# Patient Record
Sex: Female | Born: 1945 | Race: White | Hispanic: No | Marital: Married | Smoking: Former smoker
Health system: Southern US, Community
[De-identification: ages and names within clinical notes are randomized; demographics above are authoritative.]

## PROBLEM LIST (undated history)

## (undated) DIAGNOSIS — N952 Postmenopausal atrophic vaginitis: Secondary | ICD-10-CM

## (undated) DIAGNOSIS — K219 Gastro-esophageal reflux disease without esophagitis: Secondary | ICD-10-CM

## (undated) DIAGNOSIS — C4499 Other specified malignant neoplasm of skin, unspecified: Secondary | ICD-10-CM

## (undated) DIAGNOSIS — R42 Dizziness and giddiness: Secondary | ICD-10-CM

## (undated) DIAGNOSIS — C50919 Malignant neoplasm of unspecified site of unspecified female breast: Secondary | ICD-10-CM

## (undated) DIAGNOSIS — C801 Malignant (primary) neoplasm, unspecified: Secondary | ICD-10-CM

## (undated) DIAGNOSIS — H9319 Tinnitus, unspecified ear: Secondary | ICD-10-CM

## (undated) DIAGNOSIS — R519 Headache, unspecified: Secondary | ICD-10-CM

## (undated) DIAGNOSIS — Z923 Personal history of irradiation: Secondary | ICD-10-CM

## (undated) DIAGNOSIS — M722 Plantar fascial fibromatosis: Secondary | ICD-10-CM

## (undated) DIAGNOSIS — K589 Irritable bowel syndrome without diarrhea: Secondary | ICD-10-CM

## (undated) DIAGNOSIS — E785 Hyperlipidemia, unspecified: Secondary | ICD-10-CM

## (undated) HISTORY — PX: BREAST LUMPECTOMY: SHX2

## (undated) HISTORY — PX: BASAL CELL CARCINOMA EXCISION: SHX1214

## (undated) HISTORY — PX: OTHER SURGICAL HISTORY: SHX169

---

## 1999-11-13 DIAGNOSIS — Z923 Personal history of irradiation: Secondary | ICD-10-CM

## 1999-11-13 DIAGNOSIS — C50919 Malignant neoplasm of unspecified site of unspecified female breast: Secondary | ICD-10-CM

## 1999-11-13 HISTORY — DX: Malignant neoplasm of unspecified site of unspecified female breast: C50.919

## 1999-11-13 HISTORY — PX: BREAST BIOPSY: SHX20

## 1999-11-13 HISTORY — DX: Personal history of irradiation: Z92.3

## 1999-11-13 HISTORY — PX: MASTECTOMY: SHX3

## 2009-01-03 ENCOUNTER — Ambulatory Visit: Payer: Self-pay | Admitting: Gastroenterology

## 2010-07-09 ENCOUNTER — Other Ambulatory Visit: Payer: Self-pay | Admitting: Family Medicine

## 2012-07-31 ENCOUNTER — Other Ambulatory Visit: Payer: Self-pay | Admitting: Gastroenterology

## 2013-01-27 ENCOUNTER — Ambulatory Visit: Payer: Self-pay

## 2014-01-11 ENCOUNTER — Ambulatory Visit: Payer: Self-pay | Admitting: Gastroenterology

## 2014-01-13 LAB — PATHOLOGY REPORT

## 2014-02-19 ENCOUNTER — Ambulatory Visit: Payer: Self-pay

## 2015-02-21 ENCOUNTER — Ambulatory Visit
Admit: 2015-02-21 | Disposition: A | Discharge status: Home / Self Care | Payer: Self-pay | Attending: Obstetrics and Gynecology | Admitting: Obstetrics and Gynecology

## 2015-03-10 DIAGNOSIS — L280 Lichen simplex chronicus: Secondary | ICD-10-CM | POA: Insufficient documentation

## 2015-03-28 DIAGNOSIS — R768 Other specified abnormal immunological findings in serum: Secondary | ICD-10-CM | POA: Insufficient documentation

## 2015-04-18 DIAGNOSIS — N952 Postmenopausal atrophic vaginitis: Secondary | ICD-10-CM | POA: Insufficient documentation

## 2015-04-18 DIAGNOSIS — C50919 Malignant neoplasm of unspecified site of unspecified female breast: Secondary | ICD-10-CM | POA: Insufficient documentation

## 2015-04-18 DIAGNOSIS — E785 Hyperlipidemia, unspecified: Secondary | ICD-10-CM | POA: Insufficient documentation

## 2015-04-18 DIAGNOSIS — K589 Irritable bowel syndrome without diarrhea: Secondary | ICD-10-CM | POA: Insufficient documentation

## 2015-04-18 DIAGNOSIS — K219 Gastro-esophageal reflux disease without esophagitis: Secondary | ICD-10-CM | POA: Insufficient documentation

## 2015-04-18 DIAGNOSIS — M722 Plantar fascial fibromatosis: Secondary | ICD-10-CM | POA: Insufficient documentation

## 2016-02-09 DIAGNOSIS — R002 Palpitations: Secondary | ICD-10-CM | POA: Insufficient documentation

## 2016-03-04 ENCOUNTER — Encounter: Payer: Self-pay | Admitting: *Deleted

## 2016-03-04 ENCOUNTER — Emergency Department: Payer: Medicare Other

## 2016-03-04 ENCOUNTER — Emergency Department
Admission: EM | Admit: 2016-03-04 | Discharge: 2016-03-04 | Disposition: A | Discharge status: Home / Self Care | Payer: Medicare Other | Attending: Emergency Medicine | Admitting: Emergency Medicine

## 2016-03-04 DIAGNOSIS — R002 Palpitations: Secondary | ICD-10-CM | POA: Diagnosis not present

## 2016-03-04 DIAGNOSIS — Z853 Personal history of malignant neoplasm of breast: Secondary | ICD-10-CM | POA: Diagnosis not present

## 2016-03-04 HISTORY — DX: Malignant (primary) neoplasm, unspecified: C80.1

## 2016-03-04 LAB — CBC
HCT: 41.4 % (ref 35.0–47.0)
Hemoglobin: 14.3 g/dL (ref 12.0–16.0)
MCH: 36.3 pg — AB (ref 26.0–34.0)
MCHC: 34.6 g/dL (ref 32.0–36.0)
MCV: 104.9 fL — ABNORMAL HIGH (ref 80.0–100.0)
PLATELETS: 333 10*3/uL (ref 150–440)
RBC: 3.95 MIL/uL (ref 3.80–5.20)
RDW: 12.9 % (ref 11.5–14.5)
WBC: 9 10*3/uL (ref 3.6–11.0)

## 2016-03-04 LAB — BASIC METABOLIC PANEL
Anion gap: 10 (ref 5–15)
BUN: 14 mg/dL (ref 6–20)
CALCIUM: 9.7 mg/dL (ref 8.9–10.3)
CO2: 22 mmol/L (ref 22–32)
CREATININE: 0.69 mg/dL (ref 0.44–1.00)
Chloride: 110 mmol/L (ref 101–111)
GFR calc non Af Amer: >60 mL/min (ref >60)
Glucose, Bld: 87 mg/dL (ref 65–99)
Potassium: 3.7 mmol/L (ref 3.5–5.1)
SODIUM: 142 mmol/L (ref 135–145)

## 2016-03-04 MED ORDER — METOPROLOL TARTRATE 25 MG PO TABS: 25.0000 mg | ORAL_TABLET | Freq: Two times a day (BID) | ORAL | Status: DC

## 2016-03-04 MED ORDER — METOPROLOL TARTRATE 25 MG PO TABS
25.0000 mg | ORAL_TABLET | Freq: Once | ORAL | Status: AC
  Administered 2016-03-04: 25 mg via ORAL
  Filled 2016-03-04: qty 1

## 2016-03-04 NOTE — Discharge Instructions (Signed)

## 2016-03-04 NOTE — ED Notes (Signed)
Patient states she has noticed an irregular heart rate since early March and had a Holter monitor on for 24 hours at the end of March. Patient states the monitor showed PVC's and "atrial irregularities." Patient states she has felt more irregularities in the last 12 hours. Patient denies shortness of breath, but does state she is feeling anxious and a little light-headed.

## 2016-03-04 NOTE — ED Notes (Signed)
Pt verbalized understanding of discharge instructions. NAD at this time. 

## 2016-03-04 NOTE — ED Provider Notes (Signed)
[facility] Emergency Department Provider Note  Time seen: 5:55 PM  I have reviewed the triage vital signs and the nursing notes.   HISTORY  Chief Complaint Irregular Heart Beat    HPI Dorothy Turner is a 70 y.o. female with no past medical history who presents to the emergency department with an irregular heartbeat. According to the patient for the past several months she has been having frequent heart palpitations. She states these are most evident at night but occur throughout the day. States they take her breath away and have been detrimental to her daily life. Patient saw Dr. Saralyn Pilar at the end of March for the same, had a 24 hour Holter monitor showing mostly PVCs with occasional PAC. Patient states she had a very bad experience with the cardiology office, got into a verbal argument over the phone, and was never able to get a follow-up appointment. Patient states she continues to have these irregular heartbeats and feels like they have worsened. Denies any chest pain. Patient was unable to get in with cardiology or to get the official results of the Holter monitor so she came to the emergency department.     Past Medical History  Diagnosis Date  . Cancer ([place])     right breast, squamous and basil cell     There are no active problems to display for this patient.   Past Surgical History  Procedure Laterality Date  . Breast lumpectomy Right     2001 with radiation  . 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  Allergies Review of patient's allergies indicates no known allergies.  No family history on file.  Social History Social History  Substance Use Topics  . Smoking status: Never Smoker   . Smokeless tobacco: None  . Alcohol Use: Yes     Comment: 1-2 glasses of wine per night    Review of Systems Constitutional: Negative for fever. Cardiovascular: Negative for chest pain.Positive  palpitations. Respiratory: Negative for shortness of breath. Gastrointestinal: Negative for abdominal pain Neurological: Negative for headache 10-point ROS otherwise negative.  ____________________________________________   PHYSICAL EXAM:  VITAL SIGNS: ED Triage Vitals  Enc Vitals Group     BP 03/04/16 1618 159/73 mmHg     Pulse Rate 03/04/16 1618 78     Resp 03/04/16 1618 18     Temp 03/04/16 1618 98 F (36.7 C)     Temp Source 03/04/16 1618 Oral     SpO2 03/04/16 1618 100 %     Weight 03/04/16 1618 155 lb (70.308 kg)     Height 03/04/16 1618 5\' 3"  (1.6 m)     Head Cir --      Peak Flow --      Pain Score --      Pain Loc --      Pain Edu? --      Excl. in [place]? --     Constitutional: Alert and oriented. Well appearing and in no distress. Eyes: Normal exam ENT   Head: Normocephalic and atraumatic.   Mouth/Throat: Mucous membranes are moist. Cardiovascular: Normal rate, regular rhythm. No murmur Respiratory: Normal respiratory effort without tachypnea nor retractions. Breath sounds are clear Gastrointestinal: Soft and nontender. No distention.   Musculoskeletal: Nontender with normal range of motion in all extremities. Neurologic:  Normal speech and language. No gross focal neurologic deficits Skin:  Skin is warm, dry and intact.  Psychiatric: Mood and affect are normal. Speech and behavior are normal.   ____________________________________________    EKG  EKG reviewed and interpreted by myself shows normal sinus rhythm at 75 bpm, narrow QRS, normal axis, normal intervals, no concerning ST changes. Overall normal EKG.  Chest x-ray negative ____________________________________________   INITIAL IMPRESSION / ASSESSMENT AND PLAN / ED COURSE  Pertinent labs & imaging results that were available during my care of the patient were reviewed by me and considered in my medical decision making (see chart for details).  Patient presents to the emergency department  frequent palpitations. I was able to review the patient's records from cardiology. Patient had a 24 hour Holter monitor performed showing frequent PVCs and occasional PAC. Given the patient's frequency of her PVCs, we'll place the patient on metoprolol and have her follow-up with a different cardiologist per her request. Patient's labs are within normal limits including potassium. EKG is reassuring.  ____________________________________________   FINAL CLINICAL IMPRESSION(S) / ED DIAGNOSES  Palpitations   Harvest Dark, MD 03/04/16 [phone]

## 2016-03-19 ENCOUNTER — Other Ambulatory Visit: Payer: Self-pay | Admitting: Obstetrics and Gynecology

## 2016-03-19 DIAGNOSIS — Z1231 Encounter for screening mammogram for malignant neoplasm of breast: Secondary | ICD-10-CM

## 2016-03-28 ENCOUNTER — Other Ambulatory Visit: Payer: Self-pay | Admitting: Obstetrics and Gynecology

## 2016-03-28 ENCOUNTER — Ambulatory Visit
Admission: RE | Admit: 2016-03-28 | Discharge: 2016-03-28 | Disposition: A | Discharge status: Home / Self Care | Payer: Medicare Other | Source: Ambulatory Visit | Attending: Obstetrics and Gynecology | Admitting: Obstetrics and Gynecology

## 2016-03-28 DIAGNOSIS — Z1231 Encounter for screening mammogram for malignant neoplasm of breast: Secondary | ICD-10-CM | POA: Insufficient documentation

## 2016-03-28 HISTORY — DX: Malignant neoplasm of unspecified site of unspecified female breast: C50.919

## 2016-04-19 ENCOUNTER — Other Ambulatory Visit: Payer: Self-pay | Admitting: *Deleted

## 2016-04-19 DIAGNOSIS — E049 Nontoxic goiter, unspecified: Secondary | ICD-10-CM

## 2016-04-27 ENCOUNTER — Ambulatory Visit
Admission: RE | Admit: 2016-04-27 | Discharge: 2016-04-27 | Disposition: A | Discharge status: Home / Self Care | Payer: Medicare Other | Source: Ambulatory Visit | Attending: *Deleted | Admitting: *Deleted

## 2016-04-27 DIAGNOSIS — E049 Nontoxic goiter, unspecified: Secondary | ICD-10-CM

## 2017-02-21 ENCOUNTER — Other Ambulatory Visit: Payer: Self-pay | Admitting: Obstetrics and Gynecology

## 2017-03-04 ENCOUNTER — Encounter: Payer: Self-pay | Admitting: Podiatry

## 2017-03-04 ENCOUNTER — Ambulatory Visit (INDEPENDENT_AMBULATORY_CARE_PROVIDER_SITE_OTHER): Payer: Medicare Other | Admitting: Podiatry

## 2017-03-04 DIAGNOSIS — L603 Nail dystrophy: Secondary | ICD-10-CM

## 2017-03-04 NOTE — Progress Notes (Signed)
   Subjective:    Patient ID: ZYRIAH MASK, female    DOB: 12/28/1945, 71 y.o.   MRN: [id number]  HPI: She presents today concerned about thick painful hallux nail left. She states that she's tried over-the-counter antifungals to no avail.    Review of Systems  Eyes: Positive for pain and visual disturbance.  Gastrointestinal: Positive for abdominal distention and constipation.  Musculoskeletal: Positive for arthralgias.  All other systems reviewed and are negative.      Objective:   Physical Exam: Vital signs are stable alert and oriented 3. Pulses are palpable. Neurologic sensorium is intact per Semmes-Weinstein monofilament. Degenerative flexors are intact. Muscle strength is 5 over 5 dorsiflexion and plantar flexors and inverters everters onto the musculature is intact. Orthopedic evaluation of his rectal joints distal to the ankle for range of motion or crepitation. Cutaneous evaluation of Mr. supple well-hydrated cutis toenails are thick yellow dystrophic with mycotic ointment hallux left with sharp incurvated nail margin. Onychocryptosis is noted.      Assessment & Plan:  Assessment: Onychocryptosis nail dystrophy hallux nail left greater than the right. Fifth digital nail plate left greater than right. Appear to be mycotic.  Plan: Samples of the nail were taken today to be several pathologic evaluation discussed possible matrixectomy with her in the future.

## 2017-03-18 ENCOUNTER — Encounter: Payer: Self-pay | Admitting: Podiatry

## 2017-03-18 ENCOUNTER — Ambulatory Visit (INDEPENDENT_AMBULATORY_CARE_PROVIDER_SITE_OTHER): Payer: Medicare Other | Admitting: Podiatry

## 2017-03-18 DIAGNOSIS — L603 Nail dystrophy: Secondary | ICD-10-CM

## 2017-03-18 DIAGNOSIS — Z79899 Other long term (current) drug therapy: Secondary | ICD-10-CM | POA: Diagnosis not present

## 2017-03-18 MED ORDER — TERBINAFINE HCL 250 MG PO TABS: 250.0000 mg | ORAL_TABLET | Freq: Every day | ORAL | 0 refills | Status: DC

## 2017-03-18 NOTE — Progress Notes (Signed)
She presents today for follow-up of her positive pathology report for fungus.  Objective: Onychomycosis per pathology report.  Assessment: Onychomycosis.  Plan started her on Lamisil today after discussion of oral versus laser therapy. She understands the possible consequences and side effects of the medication. We provided her with a liver profile requisition and a prescription for 30 days worth of terbinafine. I will follow-up with her in 1 month should have liver plantar profile come back abnormal I will notify her immediately.

## 2017-03-18 NOTE — Patient Instructions (Signed)

## 2017-03-19 ENCOUNTER — Telehealth: Payer: Self-pay | Admitting: *Deleted

## 2017-03-19 LAB — HEPATIC FUNCTION PANEL
ALK PHOS: 90 IU/L (ref 39–117)
ALT: 22 IU/L (ref 0–32)
AST: 28 IU/L (ref 0–40)
Albumin: 4.6 g/dL (ref 3.6–4.8)
BILIRUBIN TOTAL: 0.4 mg/dL (ref 0.0–1.2)
BILIRUBIN, DIRECT: 0.11 mg/dL (ref 0.00–0.40)
Total Protein: 6.7 g/dL (ref 6.0–8.5)

## 2017-03-19 NOTE — Telephone Encounter (Addendum)
-----   Message from Garrel Ridgel, [place] sent at 03/19/2017  7:11 AM EDT ----- Blood work looks good so may continue medication. Left message informing pt of Dr. Stephenie Acres orders.

## 2017-03-20 ENCOUNTER — Other Ambulatory Visit: Payer: Self-pay | Admitting: Obstetrics and Gynecology

## 2017-03-20 DIAGNOSIS — Z1231 Encounter for screening mammogram for malignant neoplasm of breast: Secondary | ICD-10-CM

## 2017-03-25 DIAGNOSIS — M1712 Unilateral primary osteoarthritis, left knee: Secondary | ICD-10-CM | POA: Insufficient documentation

## 2017-04-05 ENCOUNTER — Ambulatory Visit
Admission: RE | Admit: 2017-04-05 | Discharge: 2017-04-05 | Disposition: A | Discharge status: Home / Self Care | Payer: Medicare Other | Source: Ambulatory Visit | Attending: Obstetrics and Gynecology | Admitting: Obstetrics and Gynecology

## 2017-04-05 DIAGNOSIS — Z1231 Encounter for screening mammogram for malignant neoplasm of breast: Secondary | ICD-10-CM | POA: Diagnosis not present

## 2017-04-05 HISTORY — DX: Personal history of irradiation: Z92.3

## 2017-04-10 ENCOUNTER — Ambulatory Visit: Payer: Medicare Other | Admitting: Podiatry

## 2017-04-24 ENCOUNTER — Encounter: Payer: Self-pay | Admitting: Podiatry

## 2017-04-24 ENCOUNTER — Ambulatory Visit (INDEPENDENT_AMBULATORY_CARE_PROVIDER_SITE_OTHER): Payer: Medicare Other | Admitting: Podiatry

## 2017-04-24 DIAGNOSIS — L603 Nail dystrophy: Secondary | ICD-10-CM | POA: Diagnosis not present

## 2017-04-24 DIAGNOSIS — Z79899 Other long term (current) drug therapy: Secondary | ICD-10-CM

## 2017-04-24 MED ORDER — TERBINAFINE HCL 250 MG PO TABS: 250.0000 mg | ORAL_TABLET | Freq: Every day | ORAL | 0 refills | Status: DC

## 2017-04-24 NOTE — Progress Notes (Signed)
She presents today for follow-up of her nail fungus and her treatment plan. States that she had no problems taking the Lamisil other than a few nights of the stomach upset.  Objective: Vital signs are stable she is alert and oriented 3. She denies fever chills nausea vomiting muscle aches and pains. No change in physical exam yet.  Assessment: Onychomycosis seen treated long-term with Lamisil.  Plan: Follow up with me in 4 months. Dispensed a requisition for blood work. I will call her with these results. Rotator prescription for Lamisil No. 90 one tablet daily.

## 2017-04-26 LAB — HEPATIC FUNCTION PANEL
ALBUMIN: 4.6 g/dL (ref 3.6–4.8)
ALT: 20 IU/L (ref 0–32)
AST: 24 IU/L (ref 0–40)
Alkaline Phosphatase: 82 IU/L (ref 39–117)
BILIRUBIN TOTAL: 0.3 mg/dL (ref 0.0–1.2)
BILIRUBIN, DIRECT: 0.11 mg/dL (ref 0.00–0.40)
Total Protein: 6.6 g/dL (ref 6.0–8.5)

## 2017-04-29 ENCOUNTER — Telehealth: Payer: Self-pay | Admitting: *Deleted

## 2017-04-29 NOTE — Telephone Encounter (Addendum)
-----   Message from Garrel Ridgel, [place] sent at 04/28/2017 10:43 AM EDT ----- Blood work looks great continue medication.04/29/2017-Left message informing pt informing pt of Dr. Stephenie Acres review of results and orders, and to call with concerns.

## 2017-08-26 ENCOUNTER — Encounter: Payer: Self-pay | Admitting: Podiatry

## 2017-08-26 ENCOUNTER — Ambulatory Visit (INDEPENDENT_AMBULATORY_CARE_PROVIDER_SITE_OTHER): Payer: Medicare Other | Admitting: Podiatry

## 2017-08-26 VITALS — BP 119/75 | HR 97 | Resp 16

## 2017-08-26 DIAGNOSIS — L603 Nail dystrophy: Secondary | ICD-10-CM | POA: Diagnosis not present

## 2017-08-26 DIAGNOSIS — Z79899 Other long term (current) drug therapy: Secondary | ICD-10-CM

## 2017-08-26 MED ORDER — TERBINAFINE HCL 250 MG PO TABS: 250.0000 mg | ORAL_TABLET | Freq: Every day | ORAL | 0 refills | Status: DC

## 2017-08-26 NOTE — Progress Notes (Signed)
She presents today for follow-up of her Lamisil. She has completed 120 days of Lamisil medication at this time. She states my toes are looking great happy with the outcome. Denies any problem with the medication other than a little stomach upset occasionally.  Objective: Vital signs are stable alert and oriented 3. Pulses are palpable. Toenails appear to be 85-90% improved.  Assessment: Onychomycosis.  Plan: Continue treating with Lamisil 250 mg 1 by mouth every other day. #30 no refills follow up with me in 3 months.

## 2017-08-26 NOTE — Patient Instructions (Signed)
Dr. Hyatt has sent over a refill for Lamisil to your pharmacy today. The instructions on your bottle will say "take 1 tablet daily", however, he would like for you to take one pill every other day. He will follow up with you in 3 months to re-evaluate your toenails. 

## 2017-11-12 HISTORY — PX: VULVECTOMY PARTIAL: SHX6187

## 2017-11-27 ENCOUNTER — Ambulatory Visit (INDEPENDENT_AMBULATORY_CARE_PROVIDER_SITE_OTHER): Payer: Medicare Other | Admitting: Podiatry

## 2017-11-27 ENCOUNTER — Encounter: Payer: Self-pay | Admitting: Podiatry

## 2017-11-27 DIAGNOSIS — L603 Nail dystrophy: Secondary | ICD-10-CM | POA: Diagnosis not present

## 2017-11-27 MED ORDER — TERBINAFINE HCL 250 MG PO TABS: 250.0000 mg | ORAL_TABLET | Freq: Every day | ORAL | 0 refills | Status: DC

## 2017-11-27 NOTE — Patient Instructions (Signed)
Dr. Hyatt has sent over a refill for Lamisil to your pharmacy today. The instructions on your bottle will say "take 1 tablet daily", however, he would like for you to take one pill every other day. He will follow up with you in 3 months to re-evaluate your toenails. 

## 2017-11-27 NOTE — Progress Notes (Signed)
She presents today for follow-up of nail dystrophy and a med check.  She states that have not noticed that much improvement but I can tell that it seems to be growing a little bit she states that they are not as dark in color as they were.  Objective: Vital signs are stable she is alert and oriented x3.  Mild sharp incurvated nail margins to the left hallux along the fibular border.  Otherwise the nail plates appear to be growing out quite nicely approximately two thirds of the way out.  Assessment: Mild paronychia due to ingrown nail which I trimmed for her today also long-term therapy with Lamisil.  Plan: Another 30 tablets 1 tablet every other day for the next 2 months follow-up with me in 3 months I did debride the nail for her today follow-up with me

## 2018-02-20 DIAGNOSIS — R300 Dysuria: Secondary | ICD-10-CM | POA: Insufficient documentation

## 2018-02-20 DIAGNOSIS — N814 Uterovaginal prolapse, unspecified: Secondary | ICD-10-CM | POA: Insufficient documentation

## 2018-02-20 DIAGNOSIS — R140 Abdominal distension (gaseous): Secondary | ICD-10-CM | POA: Insufficient documentation

## 2018-02-26 ENCOUNTER — Encounter: Payer: Self-pay | Admitting: Podiatry

## 2018-02-26 ENCOUNTER — Ambulatory Visit: Payer: Medicare Other | Admitting: Podiatry

## 2018-02-26 ENCOUNTER — Ambulatory Visit (INDEPENDENT_AMBULATORY_CARE_PROVIDER_SITE_OTHER): Payer: Medicare Other | Admitting: Podiatry

## 2018-02-26 DIAGNOSIS — L603 Nail dystrophy: Secondary | ICD-10-CM | POA: Diagnosis not present

## 2018-02-26 MED ORDER — TERBINAFINE HCL 250 MG PO TABS: 250.0000 mg | ORAL_TABLET | Freq: Every day | ORAL | 0 refills | Status: DC

## 2018-02-26 NOTE — Progress Notes (Signed)
She presents today for follow-up of her Lamisil therapy.  She states that it has improved a lot and very happy with the outcome is not nearly as painful as it was in the past.  She states they are not thickened not as curved as before.  She denies any problems taking the medication.  States that there has been no change in her medication profile.  Objective: Vital signs are stable she is alert and oriented x3 her toenails are approximately 95% grown out clear.  They have decreased in curvature considerably particularly the one on the left foot.  Assessment: Long-term therapy with Lamisil secondary to onychomycosis.  Plan: At this point since they are not grown out 100% I feel that is necessary to continue at least an every other day regimen of Lamisil tablets 250 mg tablets 1 p.o. every other day and I will follow-up with her in 3 months.

## 2018-03-31 ENCOUNTER — Other Ambulatory Visit: Payer: Self-pay | Admitting: Obstetrics and Gynecology

## 2018-03-31 DIAGNOSIS — Z1231 Encounter for screening mammogram for malignant neoplasm of breast: Secondary | ICD-10-CM

## 2018-04-17 ENCOUNTER — Ambulatory Visit
Admission: RE | Admit: 2018-04-17 | Discharge: 2018-04-17 | Disposition: A | Discharge status: Home / Self Care | Payer: Medicare Other | Source: Ambulatory Visit | Attending: Obstetrics and Gynecology | Admitting: Obstetrics and Gynecology

## 2018-04-17 DIAGNOSIS — Z1231 Encounter for screening mammogram for malignant neoplasm of breast: Secondary | ICD-10-CM | POA: Diagnosis not present

## 2018-04-21 ENCOUNTER — Other Ambulatory Visit: Payer: Self-pay | Admitting: Obstetrics and Gynecology

## 2018-04-21 DIAGNOSIS — C519 Malignant neoplasm of vulva, unspecified: Secondary | ICD-10-CM | POA: Insufficient documentation

## 2018-04-21 DIAGNOSIS — R921 Mammographic calcification found on diagnostic imaging of breast: Secondary | ICD-10-CM

## 2018-04-21 DIAGNOSIS — R928 Other abnormal and inconclusive findings on diagnostic imaging of breast: Secondary | ICD-10-CM

## 2018-05-07 ENCOUNTER — Ambulatory Visit
Admission: RE | Admit: 2018-05-07 | Discharge: 2018-05-07 | Disposition: A | Discharge status: Home / Self Care | Payer: Medicare Other | Source: Ambulatory Visit | Attending: Obstetrics and Gynecology | Admitting: Obstetrics and Gynecology

## 2018-05-07 DIAGNOSIS — R928 Other abnormal and inconclusive findings on diagnostic imaging of breast: Secondary | ICD-10-CM | POA: Diagnosis present

## 2018-05-07 DIAGNOSIS — R921 Mammographic calcification found on diagnostic imaging of breast: Secondary | ICD-10-CM | POA: Diagnosis present

## 2018-05-19 ENCOUNTER — Ambulatory Visit: Payer: Medicare Other

## 2018-05-19 ENCOUNTER — Other Ambulatory Visit: Payer: Medicare Other

## 2018-05-28 ENCOUNTER — Encounter: Payer: Self-pay | Admitting: Podiatry

## 2018-05-28 ENCOUNTER — Ambulatory Visit (INDEPENDENT_AMBULATORY_CARE_PROVIDER_SITE_OTHER): Payer: Medicare Other | Admitting: Podiatry

## 2018-05-28 DIAGNOSIS — L603 Nail dystrophy: Secondary | ICD-10-CM | POA: Diagnosis not present

## 2018-05-28 NOTE — Progress Notes (Signed)
She presents today for follow-up of her nail fungus.  States that seems to be doing so much better is very happy with the outcome thus far.  She denied any problems taking medication.  Objective: Vital signs are stable she is alert and oriented x3.  Toenail plates appear to be 856% resolved and free of fungus.  Assessment: Well-healing onychomycosis.  Plan: Discontinue the use of Lamisil follow-up with me on as-needed basis.

## 2018-10-14 ENCOUNTER — Other Ambulatory Visit: Payer: Self-pay | Admitting: Obstetrics and Gynecology

## 2018-10-14 DIAGNOSIS — R921 Mammographic calcification found on diagnostic imaging of breast: Secondary | ICD-10-CM

## 2018-11-07 ENCOUNTER — Ambulatory Visit
Admission: RE | Admit: 2018-11-07 | Discharge: 2018-11-07 | Disposition: A | Discharge status: Home / Self Care | Payer: Medicare Other | Source: Ambulatory Visit | Attending: Obstetrics and Gynecology | Admitting: Obstetrics and Gynecology

## 2018-11-07 DIAGNOSIS — R921 Mammographic calcification found on diagnostic imaging of breast: Secondary | ICD-10-CM | POA: Diagnosis present

## 2018-11-11 ENCOUNTER — Other Ambulatory Visit: Payer: Self-pay | Admitting: Obstetrics and Gynecology

## 2018-11-11 DIAGNOSIS — R928 Other abnormal and inconclusive findings on diagnostic imaging of breast: Secondary | ICD-10-CM

## 2018-11-11 DIAGNOSIS — Z1231 Encounter for screening mammogram for malignant neoplasm of breast: Secondary | ICD-10-CM

## 2018-11-11 DIAGNOSIS — R921 Mammographic calcification found on diagnostic imaging of breast: Secondary | ICD-10-CM

## 2018-11-18 ENCOUNTER — Other Ambulatory Visit (HOSPITAL_COMMUNITY): Payer: Self-pay | Admitting: Otolaryngology

## 2018-11-18 ENCOUNTER — Other Ambulatory Visit: Payer: Self-pay | Admitting: Otolaryngology

## 2018-11-18 DIAGNOSIS — IMO0001 Reserved for inherently not codable concepts without codable children: Secondary | ICD-10-CM

## 2018-11-18 DIAGNOSIS — R42 Dizziness and giddiness: Secondary | ICD-10-CM

## 2018-11-18 DIAGNOSIS — H903 Sensorineural hearing loss, bilateral: Secondary | ICD-10-CM

## 2018-11-26 ENCOUNTER — Ambulatory Visit
Admission: RE | Admit: 2018-11-26 | Discharge: 2018-11-26 | Disposition: A | Discharge status: Home / Self Care | Payer: Medicare Other | Source: Ambulatory Visit | Attending: Otolaryngology | Admitting: Otolaryngology

## 2018-11-26 DIAGNOSIS — R42 Dizziness and giddiness: Secondary | ICD-10-CM | POA: Insufficient documentation

## 2018-11-26 DIAGNOSIS — IMO0001 Reserved for inherently not codable concepts without codable children: Secondary | ICD-10-CM

## 2018-11-26 DIAGNOSIS — H903 Sensorineural hearing loss, bilateral: Secondary | ICD-10-CM | POA: Diagnosis present

## 2018-11-26 LAB — POCT I-STAT CREATININE: CREATININE: 0.7 mg/dL (ref 0.44–1.00)

## 2018-11-26 MED ORDER — GADOBUTROL 1 MMOL/ML IV SOLN
7.0000 mL | Freq: Once | INTRAVENOUS | Status: AC | PRN
  Administered 2018-11-26: 7 mL via INTRAVENOUS

## 2019-03-04 DIAGNOSIS — R42 Dizziness and giddiness: Secondary | ICD-10-CM | POA: Insufficient documentation

## 2019-03-04 DIAGNOSIS — G4700 Insomnia, unspecified: Secondary | ICD-10-CM | POA: Insufficient documentation

## 2019-03-04 DIAGNOSIS — H9319 Tinnitus, unspecified ear: Secondary | ICD-10-CM | POA: Insufficient documentation

## 2019-04-01 DIAGNOSIS — G2581 Restless legs syndrome: Secondary | ICD-10-CM | POA: Insufficient documentation

## 2019-04-01 DIAGNOSIS — R519 Headache, unspecified: Secondary | ICD-10-CM | POA: Insufficient documentation

## 2019-04-01 DIAGNOSIS — R52 Pain, unspecified: Secondary | ICD-10-CM | POA: Insufficient documentation

## 2019-04-01 DIAGNOSIS — M6289 Other specified disorders of muscle: Secondary | ICD-10-CM | POA: Insufficient documentation

## 2019-04-20 ENCOUNTER — Ambulatory Visit
Admission: RE | Admit: 2019-04-20 | Discharge: 2019-04-20 | Disposition: A | Discharge status: Home / Self Care | Payer: Medicare Other | Source: Ambulatory Visit | Attending: Obstetrics and Gynecology | Admitting: Obstetrics and Gynecology

## 2019-04-20 ENCOUNTER — Other Ambulatory Visit: Payer: Self-pay

## 2019-04-20 DIAGNOSIS — R928 Other abnormal and inconclusive findings on diagnostic imaging of breast: Secondary | ICD-10-CM

## 2019-04-20 DIAGNOSIS — R921 Mammographic calcification found on diagnostic imaging of breast: Secondary | ICD-10-CM | POA: Diagnosis present

## 2019-06-11 DIAGNOSIS — E559 Vitamin D deficiency, unspecified: Secondary | ICD-10-CM | POA: Insufficient documentation

## 2019-06-11 DIAGNOSIS — R7982 Elevated C-reactive protein (CRP): Secondary | ICD-10-CM | POA: Insufficient documentation

## 2019-06-11 DIAGNOSIS — F419 Anxiety disorder, unspecified: Secondary | ICD-10-CM | POA: Insufficient documentation

## 2019-06-11 DIAGNOSIS — Z77120 Contact with and (suspected) exposure to mold (toxic): Secondary | ICD-10-CM | POA: Insufficient documentation

## 2019-06-11 DIAGNOSIS — E538 Deficiency of other specified B group vitamins: Secondary | ICD-10-CM | POA: Insufficient documentation

## 2019-08-14 DIAGNOSIS — N3946 Mixed incontinence: Secondary | ICD-10-CM | POA: Insufficient documentation

## 2019-09-07 ENCOUNTER — Ambulatory Visit: Payer: Federal, State, Local not specified - PPO | Admitting: Podiatry

## 2019-09-14 HISTORY — PX: ANTERIOR AND POSTERIOR VAGINAL REPAIR: SUR5

## 2019-09-14 HISTORY — PX: BLADDER SUSPENSION: SHX72

## 2019-09-14 HISTORY — PX: VAGINAL HYSTERECTOMY: SUR661

## 2020-01-03 ENCOUNTER — Ambulatory Visit: Payer: Medicare Other | Attending: Internal Medicine

## 2020-01-03 DIAGNOSIS — Z23 Encounter for immunization: Secondary | ICD-10-CM | POA: Insufficient documentation

## 2020-01-03 NOTE — Progress Notes (Signed)
   Covid-19 Vaccination Clinic  Name:  Dorothy Turner    MRN: [id number] DOB: 09-27-1946  01/03/2020  [name] was observed post Covid-19 immunization for 15 minutes without incidence. She was provided with Vaccine Information Sheet and instruction to access the V-Safe system.   [name] was instructed to call 911 with any severe reactions post vaccine: Marland Kitchen Difficulty breathing  . Swelling of your face and throat  . A fast heartbeat  . A bad rash all over your body  . Dizziness and weakness    Immunizations Administered    Name Date Dose VIS Date Route   Pfizer COVID-19 Vaccine 01/03/2020 12:15 PM 0.3 mL 10/23/2019 Intramuscular   Manufacturer: [place]   Lot: [id number]   [place]: [id number]

## 2020-01-20 ENCOUNTER — Ambulatory Visit (INDEPENDENT_AMBULATORY_CARE_PROVIDER_SITE_OTHER): Payer: Medicare Other

## 2020-01-20 ENCOUNTER — Other Ambulatory Visit: Payer: Self-pay

## 2020-01-20 ENCOUNTER — Encounter: Payer: Self-pay | Admitting: Podiatry

## 2020-01-20 ENCOUNTER — Other Ambulatory Visit: Payer: Self-pay | Admitting: Podiatry

## 2020-01-20 ENCOUNTER — Ambulatory Visit (INDEPENDENT_AMBULATORY_CARE_PROVIDER_SITE_OTHER): Payer: Medicare Other | Admitting: Podiatry

## 2020-01-20 DIAGNOSIS — M7662 Achilles tendinitis, left leg: Secondary | ICD-10-CM

## 2020-01-20 DIAGNOSIS — S86019A Strain of unspecified Achilles tendon, initial encounter: Secondary | ICD-10-CM

## 2020-01-20 DIAGNOSIS — M7661 Achilles tendinitis, right leg: Secondary | ICD-10-CM

## 2020-01-20 DIAGNOSIS — M7750 Other enthesopathy of unspecified foot: Secondary | ICD-10-CM

## 2020-01-20 MED ORDER — MELOXICAM 15 MG PO TABS: 15.0000 mg | ORAL_TABLET | Freq: Every day | ORAL | 3 refills | Status: DC

## 2020-01-20 MED ORDER — METHYLPREDNISOLONE 4 MG PO TBPK: ORAL_TABLET | ORAL | 0 refills | Status: DC

## 2020-01-20 NOTE — Progress Notes (Signed)
Subjective:  Patient ID: Dorothy Turner, female    DOB: [birth year],  MRN: [id number] HPI Chief Complaint  Patient presents with   Foot Pain    Posterior heel/achilles bilateral (R>L) - aching x several months, AM pain, knot on achilles right, tried Tylenol   Nail Problem    Hallux nail left - treated nail fungus in 2019, but nail is looking discolored again    74 y.o. female presents with the above complaint.   ROS: Denies fever chills nausea vomiting muscle aches pains calf pain back pain chest pain shortness of breath.  Past Medical History:  Diagnosis Date   Breast cancer ([place]) 2001   right breast ca with lumpectomy and rad tx.    Cancer ([place])    skin- squamous and basil cell    Personal history of radiation therapy 2001   BREAST CA   Past Surgical History:  Procedure Laterality Date   BREAST BIOPSY Left 2001   benign, marked placed   BREAST BIOPSY Right 2001   positive   BREAST LUMPECTOMY Right    2001 with radiation   left eye     reconstructive from skin cancer    Current Outpatient Medications:    acetaminophen (TYLENOL) 500 MG tablet, Take by mouth., Disp: , Rfl:    amitriptyline (ELAVIL) 10 MG tablet, Take 10 mg by mouth at bedtime., Disp: , Rfl:    busPIRone (BUSPAR) 10 MG tablet, Take 20 mg by mouth 2 (two) times daily., Disp: , Rfl:    Cholecalciferol (VITAMIN D3) 2000 units capsule, Take by mouth., Disp: , Rfl:    meloxicam (MOBIC) 15 MG tablet, Take 1 tablet (15 mg total) by mouth daily., Disp: 30 tablet, Rfl: 3   methylPREDNISolone (MEDROL DOSEPAK) 4 MG TBPK tablet, 6 day dose pack - take as directed, Disp: 21 tablet, Rfl: 0   Probiotic Product (PROBIOTIC DAILY PO), Take by mouth., Disp: , Rfl:    Specialty Vitamins Products (MG-PLUS PROTEIN PO), Take by mouth., Disp: , Rfl:    TURMERIC PO, by Does not apply route., Disp: , Rfl:    zolpidem (AMBIEN) 5 MG tablet, , Disp: , Rfl:   Allergies  Allergen Reactions   Diphenhydramine Hcl      Other reaction(s): Other (See Comments) Makes legs twitch   Review of Systems Objective:  There were no vitals filed for this visit.  General: Well developed, nourished, in no acute distress, alert and oriented x3   Dermatological: Skin is warm, dry and supple bilateral. Nails x 10 are well maintained; remaining integument appears unremarkable at this time. There are no open sores, no preulcerative lesions, no rash or signs of infection present.  Vascular: Dorsalis Pedis artery and Posterior Tibial artery pedal pulses are 2/4 bilateral with immedate capillary fill time. Pedal hair growth present. No varicosities and no lower extremity edema present bilateral.   Neruologic: Grossly intact via light touch bilateral. Vibratory intact via tuning fork bilateral. Protective threshold with Semmes Wienstein monofilament intact to all pedal sites bilateral. Patellar and Achilles deep tendon reflexes 2+ bilateral. No Babinski or clonus noted bilateral.   Musculoskeletal: No gross boney pedal deformities bilateral. No pain, crepitus, or limitation noted with foot and ankle range of motion bilateral. Muscular strength 5/5 in all groups tested bilateral.  Nonpulsatile nodularity of the Achilles tendon at the watershed area.  She has good dorsiflexion 0 degrees and a minimally tight gastroc.  Gait: Unassisted, Nonantalgic.    Radiographs:  Radiographs taken today demonstrate  rectus foot type no posterior spurring thickening of the Achilles tendon at the watershed area.  Otherwise no acute findings.  Assessment & Plan:   Assessment: Achilles tendinitis cannot rule out partial tear of the Achilles tendon or an intrasubstance tear of the Achilles Achilles tendon associated with fluoroquinolones.  Plan: Placed her in night splints.  We discussed icing and stretching start her on a Medrol Dosepak to be followed by meloxicam.  No injections were performed.     Dezmen Alcock T. [place], [place]

## 2020-01-26 ENCOUNTER — Ambulatory Visit: Payer: Medicare Other | Attending: Internal Medicine

## 2020-01-26 DIAGNOSIS — Z23 Encounter for immunization: Secondary | ICD-10-CM

## 2020-01-26 NOTE — Progress Notes (Signed)
   Covid-19 Vaccination Clinic  Name:  Dorothy Turner    MRN: [id number] DOB: November 22, 1945  01/26/2020  [name] was observed post Covid-19 immunization for 15 minutes without incident. She was provided with Vaccine Information Sheet and instruction to access the V-Safe system.   [name] was instructed to call 911 with any severe reactions post vaccine: Marland Kitchen Difficulty breathing  . Swelling of face and throat  . A fast heartbeat  . A bad rash all over body  . Dizziness and weakness   Immunizations Administered    Name Date Dose VIS Date Route   Pfizer COVID-19 Vaccine 01/26/2020 11:32 AM 0.3 mL 10/23/2019 Intramuscular   Manufacturer: [place]   Lot: [id number]   [place]: [id number]

## 2020-03-08 ENCOUNTER — Encounter: Payer: Self-pay | Admitting: Ophthalmology

## 2020-03-08 ENCOUNTER — Other Ambulatory Visit: Payer: Self-pay

## 2020-03-11 ENCOUNTER — Other Ambulatory Visit
Admission: RE | Admit: 2020-03-11 | Discharge: 2020-03-11 | Disposition: A | Discharge status: Home / Self Care | Payer: Medicare Other | Source: Ambulatory Visit | Attending: Ophthalmology | Admitting: Ophthalmology

## 2020-03-11 ENCOUNTER — Other Ambulatory Visit: Payer: Self-pay

## 2020-03-11 DIAGNOSIS — Z01812 Encounter for preprocedural laboratory examination: Secondary | ICD-10-CM | POA: Insufficient documentation

## 2020-03-11 DIAGNOSIS — Z20822 Contact with and (suspected) exposure to covid-19: Secondary | ICD-10-CM | POA: Diagnosis not present

## 2020-03-11 LAB — SARS CORONAVIRUS 2 (TAT 6-24 HRS): SARS Coronavirus 2: NEGATIVE

## 2020-03-11 NOTE — Discharge Instructions (Signed)

## 2020-03-15 ENCOUNTER — Encounter: Payer: Self-pay | Admitting: Ophthalmology

## 2020-03-15 ENCOUNTER — Ambulatory Visit: Payer: Medicare Other | Admitting: Anesthesiology

## 2020-03-15 ENCOUNTER — Ambulatory Visit
Admission: RE | Admit: 2020-03-15 | Discharge: 2020-03-15 | Disposition: A | Discharge status: Home / Self Care | Payer: Medicare Other | Attending: Ophthalmology | Admitting: Ophthalmology

## 2020-03-15 ENCOUNTER — Other Ambulatory Visit: Payer: Self-pay

## 2020-03-15 ENCOUNTER — Encounter
Admission: RE | Disposition: A | Discharge status: Home / Self Care | Payer: Self-pay | Source: Home / Self Care | Attending: Ophthalmology

## 2020-03-15 DIAGNOSIS — Z853 Personal history of malignant neoplasm of breast: Secondary | ICD-10-CM | POA: Diagnosis not present

## 2020-03-15 DIAGNOSIS — Z87891 Personal history of nicotine dependence: Secondary | ICD-10-CM | POA: Diagnosis not present

## 2020-03-15 DIAGNOSIS — Z79899 Other long term (current) drug therapy: Secondary | ICD-10-CM | POA: Diagnosis not present

## 2020-03-15 DIAGNOSIS — H2512 Age-related nuclear cataract, left eye: Secondary | ICD-10-CM | POA: Insufficient documentation

## 2020-03-15 HISTORY — DX: Other specified malignant neoplasm of skin, unspecified: C44.99

## 2020-03-15 HISTORY — DX: Gastro-esophageal reflux disease without esophagitis: K21.9

## 2020-03-15 HISTORY — DX: Headache, unspecified: R51.9

## 2020-03-15 HISTORY — DX: Dizziness and giddiness: R42

## 2020-03-15 HISTORY — DX: Tinnitus, unspecified ear: H93.19

## 2020-03-15 HISTORY — PX: CATARACT EXTRACTION W/PHACO: SHX586

## 2020-03-15 SURGERY — PHACOEMULSIFICATION, CATARACT, WITH IOL INSERTION
Anesthesia: Monitor Anesthesia Care | Site: Eye | Laterality: Left

## 2020-03-15 MED ORDER — OXYCODONE HCL 5 MG/5ML PO SOLN: 5.0000 mg | Freq: Once | ORAL | Status: DC | PRN

## 2020-03-15 MED ORDER — MOXIFLOXACIN HCL 0.5 % OP SOLN
OPHTHALMIC | Status: DC | PRN
  Administered 2020-03-15: 0.2 mL via OPHTHALMIC

## 2020-03-15 MED ORDER — NA CHONDROIT SULF-NA HYALURON 40-17 MG/ML IO SOLN
INTRAOCULAR | Status: DC | PRN
  Administered 2020-03-15: 1 mL via INTRAOCULAR

## 2020-03-15 MED ORDER — TETRACAINE HCL 0.5 % OP SOLN
1.0000 [drp] | OPHTHALMIC | Status: DC | PRN
  Administered 2020-03-15 (×3): 1 [drp] via OPHTHALMIC

## 2020-03-15 MED ORDER — ARMC OPHTHALMIC DILATING DROPS
1.0000 "application " | OPHTHALMIC | Status: DC | PRN
  Administered 2020-03-15 (×3): 1 via OPHTHALMIC

## 2020-03-15 MED ORDER — OXYCODONE HCL 5 MG PO TABS: 5.0000 mg | ORAL_TABLET | Freq: Once | ORAL | Status: DC | PRN

## 2020-03-15 MED ORDER — MIDAZOLAM HCL 2 MG/2ML IJ SOLN
INTRAMUSCULAR | Status: DC | PRN
  Administered 2020-03-15 (×2): 1 mg via INTRAVENOUS

## 2020-03-15 MED ORDER — BRIMONIDINE TARTRATE-TIMOLOL 0.2-0.5 % OP SOLN
OPHTHALMIC | Status: DC | PRN
  Administered 2020-03-15: 1 [drp] via OPHTHALMIC

## 2020-03-15 MED ORDER — LIDOCAINE HCL (PF) 2 % IJ SOLN
INTRAOCULAR | Status: DC | PRN
  Administered 2020-03-15: 1 mL

## 2020-03-15 MED ORDER — FENTANYL CITRATE (PF) 100 MCG/2ML IJ SOLN
INTRAMUSCULAR | Status: DC | PRN
  Administered 2020-03-15 (×2): 50 ug via INTRAVENOUS

## 2020-03-15 MED ORDER — EPINEPHRINE PF 1 MG/ML IJ SOLN
INTRAOCULAR | Status: DC | PRN
  Administered 2020-03-15: 56 mL via OPHTHALMIC

## 2020-03-15 SURGICAL SUPPLY — 19 items
CANNULA ANT/CHMB 27GA (MISCELLANEOUS) ×6 IMPLANT
DISSECTOR HYDRO NUCLEUS 50X22 (MISCELLANEOUS) ×3 IMPLANT
GLOVE SURG LX 8.0 MICRO (GLOVE) ×2
GLOVE SURG LX STRL 8.0 MICRO (GLOVE) ×1 IMPLANT
GLOVE SURG TRIUMPH 8.0 PF LTX (GLOVE) ×3 IMPLANT
GOWN STRL REUS W/ TWL LRG LVL3 (GOWN DISPOSABLE) ×2 IMPLANT
GOWN STRL REUS W/TWL LRG LVL3 (GOWN DISPOSABLE) ×4
LENS IOL DIOP 17.0 (Intraocular Lens) ×3 IMPLANT
LENS IOL TECNIS MONO 17.0 (Intraocular Lens) ×1 IMPLANT
MARKER SKIN DUAL TIP RULER LAB (MISCELLANEOUS) ×3 IMPLANT
NEEDLE FILTER BLUNT 18X 1/2SAF (NEEDLE) ×2
NEEDLE FILTER BLUNT 18X1 1/2 (NEEDLE) ×1 IMPLANT
PACK EYE AFTER SURG (MISCELLANEOUS) ×3 IMPLANT
PACK OPTHALMIC (MISCELLANEOUS) ×3 IMPLANT
PACK PORFILIO (MISCELLANEOUS) ×3 IMPLANT
SYR 3ML LL SCALE MARK (SYRINGE) ×3 IMPLANT
SYR TB 1ML LUER SLIP (SYRINGE) ×3 IMPLANT
WATER STERILE IRR 250ML POUR (IV SOLUTION) ×3 IMPLANT
WIPE NON LINTING 3.25X3.25 (MISCELLANEOUS) ×3 IMPLANT

## 2020-03-15 NOTE — H&P (Signed)
All labs reviewed. Abnormal studies sent to patients PCP when indicated.  Previous H&P reviewed, patient examined, there are NO CHANGES.  Dorothy Turner Porfilio5/4/202112:40 PM

## 2020-03-15 NOTE — Op Note (Signed)
PREOPERATIVE DIAGNOSIS:  Nuclear sclerotic cataract of the left eye.   POSTOPERATIVE DIAGNOSIS:  Nuclear sclerotic cataract of the left eye.   OPERATIVE PROCEDURE:@   SURGEON:  Birder Robson, MD.   ANESTHESIA:  Anesthesiologist: Fidel Levy, MD CRNA: Vanetta Shawl, CRNA  1.      Managed anesthesia care. 2.     0.59ml of Shugarcaine was instilled following the paracentesis   COMPLICATIONS:  None.   TECHNIQUE:   Stop and chop   DESCRIPTION OF PROCEDURE:  The patient was examined and consented in the preoperative holding area where the aforementioned topical anesthesia was applied to the left eye and then brought back to the Operating Room where the left eye was prepped and draped in the usual sterile ophthalmic fashion and a lid speculum was placed. A paracentesis was created with the side port blade and the anterior chamber was filled with viscoelastic. A near clear corneal incision was performed with the steel keratome. A continuous curvilinear capsulorrhexis was performed with a cystotome followed by the capsulorrhexis forceps. Hydrodissection and hydrodelineation were carried out with BSS on a blunt cannula. The lens was removed in a stop and chop  technique and the remaining cortical material was removed with the irrigation-aspiration handpiece. The capsular bag was inflated with viscoelastic and the Technis [id number] lens was placed in the capsular bag without complication. The remaining viscoelastic was removed from the eye with the irrigation-aspiration handpiece. The wounds were hydrated. The anterior chamber was flushed with BSS and the eye was inflated to physiologic pressure. 0.20ml Vigamox was placed in the anterior chamber. The wounds were found to be water tight. The eye was dressed with Combigan. The patient was given protective glasses to wear throughout the day and a shield with which to sleep tonight. The patient was also given drops with which to begin a drop regimen today  and will follow-up with me in one day. Implant Name Type Inv. Item Serial No. Manufacturer Lot No. LRB No. Used Action  LENS IOL DIOP 17.0 - [id number] Intraocular Lens LENS IOL DIOP 17.0 [id number] AMO  Left 1 Implanted    Procedure(s): CATARACT EXTRACTION PHACO AND INTRAOCULAR LENS PLACEMENT (IOC) LEFT 10.91 01:01.4 (Left)  Electronically signed: Birder Robson 03/15/2020 1:11 PM

## 2020-03-15 NOTE — Anesthesia Postprocedure Evaluation (Signed)
Anesthesia Post Note  Patient: Dorothy Turner  Procedure(s) Performed: CATARACT EXTRACTION PHACO AND INTRAOCULAR LENS PLACEMENT (IOC) LEFT 10.91 01:01.4 (Left Eye)     Patient location during evaluation: PACU Anesthesia Type: MAC Level of consciousness: awake and alert Pain management: pain level controlled Vital Signs Assessment: post-procedure vital signs reviewed and stable Respiratory status: spontaneous breathing, nonlabored ventilation, respiratory function stable and patient connected to nasal cannula oxygen Cardiovascular status: stable and blood pressure returned to baseline Postop Assessment: no apparent nausea or vomiting Anesthetic complications: no    Fionn Stracke

## 2020-03-15 NOTE — Anesthesia Preprocedure Evaluation (Signed)
Anesthesia Evaluation  Patient identified by MRN, date of birth, ID band Patient awake    Reviewed: NPO status   History of Anesthesia Complications Negative for: history of anesthetic complications  Airway Mallampati: II  TM Distance: >3 FB Neck ROM: full    Dental no notable dental hx.    Pulmonary neg pulmonary ROS, Not current smoker, former smoker,    Pulmonary exam normal        Cardiovascular Exercise Tolerance: Good negative cardio ROS Normal cardiovascular exam     Neuro/Psych Anxiety negative neurological ROS     GI/Hepatic Neg liver ROS, GERD  Controlled,  Endo/Other  negative endocrine ROS  Renal/GU negative Renal ROS   Paget disease, extra-mammary, of vulva negative genitourinary   Musculoskeletal  (+) Arthritis ,   Abdominal   Peds  Hematology R Br.Ca (2001) > lumpectomy.    Anesthesia Other Findings Covid: NEG.  Ekg: nsr 09/2019.  Reproductive/Obstetrics                             Anesthesia Physical Anesthesia Plan  ASA: II  Anesthesia Plan: MAC   Post-op Pain Management:    Induction:   PONV Risk Score and Plan: 2 and TIVA and Midazolam  Airway Management Planned:   Additional Equipment:   Intra-op Plan:   Post-operative Plan:   Informed Consent: I have reviewed the patients History and Physical, chart, labs and discussed the procedure including the risks, benefits and alternatives for the proposed anesthesia with the patient or authorized representative who has indicated his/her understanding and acceptance.       Plan Discussed with: CRNA  Anesthesia Plan Comments:         Anesthesia Quick Evaluation

## 2020-03-15 NOTE — Transfer of Care (Signed)
Immediate Anesthesia Transfer of Care Note  Patient: Dorothy Turner  Procedure(s) Performed: CATARACT EXTRACTION PHACO AND INTRAOCULAR LENS PLACEMENT (IOC) LEFT 10.91 01:01.4 (Left Eye)  Patient Location: PACU  Anesthesia Type: MAC  Level of Consciousness: awake, alert  and patient cooperative  Airway and Oxygen Therapy: Patient Spontanous Breathing   Post-op Assessment: Post-op Vital signs reviewed, Patient's Cardiovascular Status Stable, Respiratory Function Stable, Patent Airway and No signs of Nausea or vomiting  Post-op Vital Signs: Reviewed and stable  Complications: No apparent anesthesia complications

## 2020-03-16 ENCOUNTER — Encounter: Payer: Self-pay | Admitting: *Deleted

## 2020-03-21 ENCOUNTER — Other Ambulatory Visit: Payer: Self-pay | Admitting: Cardiology

## 2020-03-21 ENCOUNTER — Encounter: Payer: Self-pay | Admitting: Podiatry

## 2020-03-21 ENCOUNTER — Ambulatory Visit (INDEPENDENT_AMBULATORY_CARE_PROVIDER_SITE_OTHER): Payer: Medicare Other | Admitting: Podiatry

## 2020-03-21 ENCOUNTER — Other Ambulatory Visit: Payer: Self-pay

## 2020-03-21 DIAGNOSIS — M7661 Achilles tendinitis, right leg: Secondary | ICD-10-CM

## 2020-03-21 DIAGNOSIS — M7662 Achilles tendinitis, left leg: Secondary | ICD-10-CM | POA: Diagnosis not present

## 2020-03-21 NOTE — Progress Notes (Signed)
She presents today for follow-up of her Achilles tendinitis bilaterally.  She states that it still hurts but I would say is 20 to 25% improved.  She states that she can go for quite some time without it bothering her.  She states that the meloxicam was causing blood in the stool so she discontinued its use.  She is currently utilizing an over-the-counter drug-free medication to help with inflammation.  She is also utilizing diclofenac gel which seems to help.  Objective: Vital signs are stable alert oriented x3 still has good plantar flexion against resistance.  Large and still demonstrate an intact Achilles tendon bilaterally there is a small lump at the watershed area of the left Achilles associated with most likely an old interstitial tear.  She does have some tight hamstrings and tight gastrocsoleus complexes consistent with lack of exercise and aging most likely part of her syndrome here.  Assessment: Achilles tendinitis bilaterally.  Left seems to be worse than the right.  And possible old tear nodule in the watershed area left Achilles.  Plan: Discussed etiology pathology conservative surgical therapies.  At this point she is already being referred to her physical therapy group from her primary care provider for other issues but she states that she is going to ask her primary care provider or physical therapy to add this on.  I expressed to her that generally we write a prescription.  She states that if she needs 1 she will be having them to send a note requesting 1.  I will follow-up with her suture.  I expressed to her that if physical therapy failed to leave her asymptomatic then we would need to consider an MRI particular the one with the nodule

## 2020-03-24 ENCOUNTER — Other Ambulatory Visit: Payer: Self-pay | Admitting: *Deleted

## 2020-03-24 DIAGNOSIS — R928 Other abnormal and inconclusive findings on diagnostic imaging of breast: Secondary | ICD-10-CM

## 2020-03-24 DIAGNOSIS — R921 Mammographic calcification found on diagnostic imaging of breast: Secondary | ICD-10-CM

## 2020-04-04 ENCOUNTER — Other Ambulatory Visit: Payer: Self-pay

## 2020-04-04 ENCOUNTER — Encounter: Payer: Self-pay | Admitting: Physical Therapy

## 2020-04-04 ENCOUNTER — Ambulatory Visit: Payer: Medicare Other | Attending: *Deleted | Admitting: Physical Therapy

## 2020-04-04 DIAGNOSIS — M25651 Stiffness of right hip, not elsewhere classified: Secondary | ICD-10-CM | POA: Insufficient documentation

## 2020-04-04 DIAGNOSIS — M25551 Pain in right hip: Secondary | ICD-10-CM | POA: Diagnosis not present

## 2020-04-04 DIAGNOSIS — M545 Low back pain, unspecified: Secondary | ICD-10-CM

## 2020-04-04 DIAGNOSIS — G8929 Other chronic pain: Secondary | ICD-10-CM | POA: Diagnosis present

## 2020-04-04 NOTE — Therapy (Signed)
[facility] [address] [place], [address] Phone: [phone]   Fax:  [phone]  Physical Therapy Evaluation  Patient Details  Name: Dorothy Turner MRN: [id number] Date of Birth: 29-Nov-1945 No data recorded  Encounter Date: 04/04/2020  PT End of Session - 04/04/20 1620    Visit Number  1    Number of Visits  17    Date for PT Re-Evaluation  05/30/20    PT Start Time  0230    PT Stop Time  0330    PT Time Calculation (min)  60 min    Activity Tolerance  Patient tolerated treatment well    Behavior During Therapy  [facility] for tasks assessed/performed       Past Medical History:  Diagnosis Date  . Breast cancer ([place]) 2001   right breast ca with lumpectomy and rad tx.   . Cancer ([place])    skin- squamous and basil cell   . GERD (gastroesophageal reflux disease)   . Headache    2-3x/week.  Stress  . Paget disease, extra-mammary   . Personal history of radiation therapy 2001   BREAST CA  . Tinnitus   . Vertigo    several months ago    Past Surgical History:  Procedure Laterality Date  . ANTERIOR AND POSTERIOR VAGINAL REPAIR  09/14/2019   [facility]  . BLADDER SUSPENSION  09/14/2019   [facility]  . BREAST BIOPSY Left 2001   benign, marked placed  . BREAST BIOPSY Right 2001   positive  . BREAST LUMPECTOMY Right    2001 with radiation  . CATARACT EXTRACTION W/PHACO Left 03/15/2020   Procedure: CATARACT EXTRACTION PHACO AND INTRAOCULAR LENS PLACEMENT (IOC) LEFT 10.91 01:01.4;  Surgeon: Birder Robson, MD;  Location: [facility];  Service: Ophthalmology;  Laterality: Left;  . left eye     reconstructive from skin cancer  . VAGINAL HYSTERECTOMY  09/14/2019   [facility]  . VULVECTOMY PARTIAL  2019    There were no vitals filed for this visit.        OBJECTIVE  Mental Status Patient is oriented to person, place and time.  Recent memory is intact.  Remote memory is intact.  Attention span and  concentration are intact.  Expressive speech is intact.  Patient's fund of knowledge is within normal limits for educational level.  SENSATION: Grossly intact to light touch bilateral LEs as determined by testing dermatomes L2-S2 Proprioception and hot/cold testing deferred on this date   MUSCULOSKELETAL: Tremor: None Bulk: Normal Tone: Normal No visible step-off along spinal column  Posture Lumbar lordosis: WNL Iliac crest height: equal bilaterally Lower crossed syndrome (tight hip flexors and erector spinae; weak gluts and abs): negative Increased R sided WB  Gait Increased R sided shift and WB, minimal trendelenberg. Bilat knee valgus with narrow BOS   Palpation  TTP with verbal discomfort to palpation of superior glute fibers/glute med and over deep rotators, with concordant sign. Very TTP over R greater trochanter and somewhat along ITB as well. Latent trigger points in bilat lumbar paraspinals  Strength (out of 5) R/L 5/5 Hip flexion 5/5 Hip ER 5/5 Hip IR 4-/5 Hip abduction 4+/5 Hip adduction 4-/4 Hip extension 5/4* Knee extension 4+/4+* Knee flexion 5/5 Ankle dorsiflexion 5/5 Ankle plantarflexion 5 Trunk flexion 5 Trunk extension 5/5 Trunk rotation  *Indicates pain   AROM (degrees) All hip and lumbar motions WNL, except hip IR which is 25% limited bilat *Indicates pain  PROM (degrees) PROM = AROM  Repeated Movements No centralization or peripheralization of symptoms with repeated lumbar extension or flexion.    Muscle Length Hamstrings: R: degrees L: degrees  Ely: postiive bilat Thomas: negative Ober: positive on R   Passive Accessory Intervertebral Motion (PAIVM) Pt denies reproduction of back pain with CPA L1-L5 and UPA bilaterally L1-L5. Generally hypomobile throughout  Passive Physiological Intervertebral Motion (PPIVM) Normal flexion and extension with PPIVM testing   SPECIAL TESTS Lumbar Radiculopathy and Discogenic: Centralization and  Peripheralization (SN 92, -LR 0.12): Negative Slump (SN 83, -LR 0.32): Negative bilat SLR (SN 92, -LR 0.29): negative Crossed SLR (SP 90):Negative   Facet Joint: Extension-Rotation (SN 100, -LR 0.0): Negative bilat  Lumbar Spinal Stenosis: Lumbar quadrant (SN 70): Negative bilat  Hip: FABER (SN 81): R:Positive L: Negative FADIR (SN 94): Negative bilat Hip scour (SN 50): Negative bilat  SIJ:  Thigh Thrust (SN 88, -LR 0.18) : Negative bilat  Piriformis Syndrome: FAIR Test (SN 88, SP 83): Positive on R only   Functional Tasks 5xSTS 16sec Squatting: Increased R WB d/t L knee pain; bilat knee valgus, midfoot pronation bilat, unable to squat past approx 100d Lateral step down: unable bilat R>L with knee valgus and midfoot pronation bilat Sit to stand:increased R WB with lateral shift  Ther-Ex PT led patient through the following therex with cuing and demonstration for good carry over. Patient verbalized understanding of education on parameters and RICE protocol for bursitis with good understanding. Education on alignment and overuse of RLE d/t increased WB from LLE pain and proper technique of squatting, standing, STS with good understanding Access Code: [email or web address] .Seated Figure 4 Piriformis Stretch - 3 x daily - 7 x weekly - 30sec hold .Sidelying ITB Stretch off Table - 3 x daily - 7 x weekly - 30sec hold Ice 10-53min 1-2x/day            Objective measurements completed on examination: See above findings.              PT Education - 04/04/20 1447    Education Details  Patient was educated on diagnosis, anatomy and pathology involved, prognosis, role of PT, and was given an HEP, demonstrating exercise with proper form following verbal and tactile cues, and was given a paper hand out to continue exercise at home. Pt was educated on and agreed to plan of care.    Person(s) Educated  Patient    Methods  Explanation;Demonstration;Tactile cues;Verbal cues;Handout     Comprehension  Verbalized understanding;Returned demonstration;Verbal cues required;Tactile cues required       PT Short Term Goals - 04/04/20 1701      PT SHORT TERM GOAL #1   Title  Pt will be independent with HEP in order to improve strength and decrease back pain in order to improve pain-free function at home and work.    Baseline  04/04/20 HEP given    Time  4    Period  Weeks    Status  New        PT Long Term Goals - 04/04/20 1702      PT LONG TERM GOAL #1   Title  Patient will increase FOTO score to 64 to demonstrate predicted increase in functional mobility to complete ADLs    Baseline  04/04/20 49    Time  8    Period  Weeks    Status  New      PT LONG TERM GOAL #2   Title  Pt will  decrease worst R hip pain as reported on NPRS by at least 2 points in order to demonstrate clinically significant reduction in back pain.    Baseline  04/04/20 7/10    Time  8    Period  Weeks    Status  New      PT LONG TERM GOAL #3   Title  Pt will decrease 5TSTS by at least 3 seconds in order to demonstrate clinically significant improvement in LE strength    Baseline  04/04/20 16sec    Time  8    Period  Weeks    Status  New      PT LONG TERM GOAL #4   Title  Patient will demonstrate normalized gait mechanics for efficiency with ambulation to decrease pain    Baseline  04/04/20 Increased R sided shift and WB, minimal trendelenberg. Bilat knee valgus with narrow BOS    Time  8    Period  Weeks    Status  New             Plan - 04/04/20 1651    Clinical Impression Statement  Pt is a 74 year old female presenting with R sided hip pain, signs and symptoms associated with greater trochanter bursitis, gluteal dysfunction/tenditis. Impairments in decreased hip strength, R hip pain, L knee pain, postural dysfunction, abnormal gait, decreased motor control/coordination, and increased soft tissue tension of gluteals. Activity limitations in bending, stooping, squating; inhibiting full  participation in ADLs (gardening, lifting). Would benefit from skilled PT to address above deficits and promote optimal return to PLOF.    Personal Factors and Comorbidities  Age;Comorbidity 1;Comorbidity 2;Fitness;Past/Current Experience;Sex    Comorbidities  HLD, anxiety, hx breast cancer    Examination-Activity Limitations  Squat;Lift;Stairs;Carry;Bend    Examination-Participation Restrictions  Cleaning;Community Activity;Meal Prep    Stability/Clinical Decision Making  Evolving/Moderate complexity    Clinical Decision Making  Moderate    Rehab Potential  Good    PT Frequency  2x / week    PT Duration  8 weeks    PT Next Visit Plan  TDN?, motor control squat    PT Home Exercise Plan  piriformis stretch, glute stretch, ice    Consulted and Agree with Plan of Care  Patient       Patient will benefit from skilled therapeutic intervention in order to improve the following deficits and impairments:  Abnormal gait, Decreased balance, Impaired flexibility, Difficulty walking, Decreased endurance, Decreased activity tolerance, Decreased strength, Decreased range of motion, Postural dysfunction, Impaired tone, Increased muscle spasms, Decreased mobility, Increased fascial restricitons, Improper body mechanics, Pain  Visit Diagnosis: Pain in right hip  Stiffness of right hip, not elsewhere classified  Chronic right-sided low back pain without sciatica     Problem List Patient Active Problem List   Diagnosis Date Noted  . Mixed incontinence 08/14/2019  . Anxiety 06/11/2019  . Elevated C-reactive protein 06/11/2019  . Exposure to mold 06/11/2019  . Vitamin B12 deficiency (non anemic) 06/11/2019  . Vitamin D deficiency 06/11/2019  . Aching pain 04/01/2019  . Headache 04/01/2019  . Pelvic floor dysfunction 04/01/2019  . Restless legs 04/01/2019  . Dizziness 03/04/2019  . Insomnia 03/04/2019  . Tinnitus 03/04/2019  . 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 Abdominal bloating  02/20/2018  . Dysuria 02/20/2018  . Uterine prolapse 02/20/2018  . Primary osteoarthritis of left knee 03/25/2017  . Heart palpitations 02/09/2016  . Gastro-esophageal reflux disease without esophagitis 04/18/2015  . Hyperlipidemia 04/18/2015  .  Irritable bowel syndrome without diarrhea 04/18/2015  . Atrophic vaginitis 04/18/2015  . Malignant neoplasm of breast ([place]) 04/18/2015  . 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 ANA positive 03/28/2015  . Lichen [id number]   Shelton Silvas PT, DPT Shelton Silvas 04/04/2020, 5:05 PM  [facility] [address] [place], [address] Phone: [phone]   Fax:  [phone]  Name: ETOLIA SPATAFORA MRN: [id number] Date of Birth: July 24, 1946

## 2020-04-06 ENCOUNTER — Other Ambulatory Visit: Payer: Self-pay

## 2020-04-06 ENCOUNTER — Encounter: Payer: Self-pay | Admitting: Physical Therapy

## 2020-04-06 ENCOUNTER — Ambulatory Visit: Payer: Medicare Other | Admitting: Physical Therapy

## 2020-04-06 DIAGNOSIS — M25651 Stiffness of right hip, not elsewhere classified: Secondary | ICD-10-CM

## 2020-04-06 DIAGNOSIS — M25551 Pain in right hip: Secondary | ICD-10-CM | POA: Diagnosis not present

## 2020-04-06 DIAGNOSIS — G8929 Other chronic pain: Secondary | ICD-10-CM

## 2020-04-06 NOTE — Therapy (Signed)
[facility] [address], [place], [address] Phone: [phone]   Fax:  [phone]  Physical Therapy Treatment  Patient Details  Name: Dorothy Turner MRN: [id number] Date of Birth: May 28, 1946 No data recorded  Encounter Date: 04/06/2020  PT End of Session - 04/06/20 1125    Visit Number  2    Number of Visits  17    Date for PT Re-Evaluation  05/30/20    PT Start Time  1120    PT Stop Time  1158    PT Time Calculation (min)  38 min    Activity Tolerance  Patient tolerated treatment well    Behavior During Therapy  [facility] for tasks assessed/performed       Past Medical History:  Diagnosis Date  . Breast cancer ([place]) 2001   right breast ca with lumpectomy and rad tx.   . Cancer ([place])    skin- squamous and basil cell   . GERD (gastroesophageal reflux disease)   . Headache    2-3x/week.  Stress  . Paget disease, extra-mammary   . Personal history of radiation therapy 2001   BREAST CA  . Tinnitus   . Vertigo    several months ago    Past Surgical History:  Procedure Laterality Date  . ANTERIOR AND POSTERIOR VAGINAL REPAIR  09/14/2019   [facility]  . BLADDER SUSPENSION  09/14/2019   [facility]  . BREAST BIOPSY Left 2001   benign, marked placed  . BREAST BIOPSY Right 2001   positive  . BREAST LUMPECTOMY Right    2001 with radiation  . CATARACT EXTRACTION W/PHACO Left 03/15/2020   Procedure: CATARACT EXTRACTION PHACO AND INTRAOCULAR LENS PLACEMENT (IOC) LEFT 10.91 01:01.4;  Surgeon: Birder Robson, MD;  Location: [facility];  Service: Ophthalmology;  Laterality: Left;  . left eye     reconstructive from skin cancer  . VAGINAL HYSTERECTOMY  09/14/2019   [facility]  . VULVECTOMY PARTIAL  2019    There were no vitals filed for this visit.  Subjective Assessment - 04/06/20 1121    Subjective  Patinet reports she is having 7/10 pain today. Reports compliance with HEP and with ice regimen. Patient reports  she cannot tell if ice is helping her pain or not.          Ther-Ex Seated piriformis stretch 2x 30sec hold Sidelying ober's stretch 2x 30sec hold Bridge 3x 10 with good carry over of cuing for glute contraction without excessive lumbar ext STS from elevated mat x12 from chair x12 with demo and max cuing for equal WB and proper hip/knee/ankle alignment with good carry over by the end of reps  Manual With patient in prone STM with trigger point release to glute med/max/over deep rotators with only very light pressure tolerated initially, increased to deep pressure and trigger pointing Rolling to ITB for increased blood flow, attempted cross friction at prox insertion, too painful, patient in sidelying with pillow between knees                        PT Education - 04/06/20 1125    Education Details  HEP review, therex technique/form    Person(s) Educated  Patient    Methods  Explanation;Demonstration;Verbal cues    Comprehension  Verbalized understanding;Returned demonstration;Verbal cues required       PT Short Term Goals - 04/04/20 1701      PT SHORT TERM GOAL #1  Title  Pt will be independent with HEP in order to improve strength and decrease back pain in order to improve pain-free function at home and work.    Baseline  04/04/20 HEP given    Time  4    Period  Weeks    Status  New        PT Long Term Goals - 04/04/20 1702      PT LONG TERM GOAL #1   Title  Patient will increase FOTO score to 64 to demonstrate predicted increase in functional mobility to complete ADLs    Baseline  04/04/20 49    Time  8    Period  Weeks    Status  New      PT LONG TERM GOAL #2   Title  Pt will decrease worst R hip pain as reported on NPRS by at least 2 points in order to demonstrate clinically significant reduction in back pain.    Baseline  04/04/20 7/10    Time  8    Period  Weeks    Status  New      PT LONG TERM GOAL #3   Title  Pt will decrease 5TSTS by  at least 3 seconds in order to demonstrate clinically significant improvement in LE strength    Baseline  04/04/20 16sec    Time  8    Period  Weeks    Status  New      PT LONG TERM GOAL #4   Title  Patient will demonstrate normalized gait mechanics for efficiency with ambulation to decrease pain    Baseline  04/04/20 Increased R sided shift and WB, minimal trendelenberg. Bilat knee valgus with narrow BOS    Time  8    Period  Weeks    Status  New            Plan - 04/06/20 1207    Clinical Impression Statement  PT initiated manual techniques for decreased muscle tension and pain with success. patient reports 7/10 prior and 1/10 pain following. PT led patient through therex for increased hip ext activation and motor control with good motivation and carry over. PT advised patient in sleep with pillow between knees, continuing HEP and ice regimen, and motor control for STS/squatting over the weekend with good verbalized understanding. PT will continue progression as able.    Personal Factors and Comorbidities  Age;Comorbidity 1;Comorbidity 2;Fitness;Past/Current Experience;Sex    Comorbidities  HLD, anxiety, hx breast cancer    Examination-Activity Limitations  Squat;Lift;Stairs;Carry;Bend    Examination-Participation Restrictions  Cleaning;Community Activity;Meal Prep    Stability/Clinical Decision Making  Evolving/Moderate complexity    Clinical Decision Making  Moderate    Rehab Potential  Good    PT Frequency  2x / week    PT Duration  8 weeks    PT Treatment/Interventions  ADLs/Self Care Home Management;Electrical Stimulation;Therapeutic activities;Patient/family education;Spinal Manipulations;Joint Manipulations;Passive range of motion;Neuromuscular re-education;Manual techniques;Functional mobility training;Cryotherapy;Ultrasound;Moist Heat;DME Instruction;Iontophoresis 4mg /ml Dexamethasone;Gait training;Stair training;Traction;Balance training;Therapeutic exercise    PT Next Visit  Plan  TDN?, motor control squat    PT Home Exercise Plan  piriformis stretch, glute stretch, ice    Consulted and Agree with Plan of Care  Patient       Patient will benefit from skilled therapeutic intervention in order to improve the following deficits and impairments:  Abnormal gait, Decreased balance, Impaired flexibility, Difficulty walking, Decreased endurance, Decreased activity tolerance, Decreased strength, Decreased range of motion, Postural dysfunction, Impaired tone, Increased muscle  spasms, Decreased mobility, Increased fascial restricitons, Improper body mechanics, Pain  Visit Diagnosis: Pain in right hip  Stiffness of right hip, not elsewhere classified  Chronic right-sided low back pain without sciatica     Problem List Patient Active Problem List   Diagnosis Date Noted  . Mixed incontinence 08/14/2019  . Anxiety 06/11/2019  . Elevated C-reactive protein 06/11/2019  . Exposure to mold 06/11/2019  . Vitamin B12 deficiency (non anemic) 06/11/2019  . Vitamin D deficiency 06/11/2019  . Aching pain 04/01/2019  . Headache 04/01/2019  . Pelvic floor dysfunction 04/01/2019  . Restless legs 04/01/2019  . Dizziness 03/04/2019  . Insomnia 03/04/2019  . Tinnitus 03/04/2019  . 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 Abdominal bloating 02/20/2018  . Dysuria 02/20/2018  . Uterine prolapse 02/20/2018  . Primary osteoarthritis of left knee 03/25/2017  . Heart palpitations 02/09/2016  . Gastro-esophageal reflux disease without esophagitis 04/18/2015  . Hyperlipidemia 04/18/2015  . Irritable bowel syndrome without diarrhea 04/18/2015  . Atrophic vaginitis 04/18/2015  . Malignant neoplasm of breast ([place]) 04/18/2015  . 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 ANA positive 03/28/2015  . Lichen [id number]   Shelton Silvas PT, DPT Shelton Silvas 04/06/2020, 12:12 PM  [facility] [address], [place], [address] Phone: [phone]   Fax:  [phone]  Name: Dorothy Turner MRN: [id number] Date of Birth: 03-Jun-1946

## 2020-04-12 ENCOUNTER — Ambulatory Visit: Payer: Medicare Other | Attending: *Deleted | Admitting: Physical Therapy

## 2020-04-12 ENCOUNTER — Other Ambulatory Visit: Payer: Self-pay

## 2020-04-12 ENCOUNTER — Encounter: Payer: Self-pay | Admitting: Physical Therapy

## 2020-04-12 DIAGNOSIS — M25551 Pain in right hip: Secondary | ICD-10-CM | POA: Diagnosis present

## 2020-04-12 DIAGNOSIS — M25651 Stiffness of right hip, not elsewhere classified: Secondary | ICD-10-CM | POA: Diagnosis present

## 2020-04-12 DIAGNOSIS — M545 Low back pain, unspecified: Secondary | ICD-10-CM

## 2020-04-12 DIAGNOSIS — G8929 Other chronic pain: Secondary | ICD-10-CM | POA: Diagnosis present

## 2020-04-12 NOTE — Therapy (Signed)
[facility] [address] [place], [address] Phone: [phone]   Fax:  [phone]  Physical Therapy Treatment  Patient Details  Name: Dorothy Turner MRN: [id number] Date of Birth: 12-24-1945 No data recorded  Encounter Date: 04/12/2020  PT End of Session - 04/12/20 1444    Visit Number  3    Number of Visits  17    Date for PT Re-Evaluation  05/30/20    PT Start Time  0235    PT Stop Time  0315    PT Time Calculation (min)  40 min    Activity Tolerance  Patient tolerated treatment well    Behavior During Therapy  [facility] for tasks assessed/performed       Past Medical History:  Diagnosis Date  . Breast cancer ([place]) 2001   right breast ca with lumpectomy and rad tx.   . Cancer ([place])    skin- squamous and basil cell   . GERD (gastroesophageal reflux disease)   . Headache    2-3x/week.  Stress  . Paget disease, extra-mammary   . Personal history of radiation therapy 2001   BREAST CA  . Tinnitus   . Vertigo    several months ago    Past Surgical History:  Procedure Laterality Date  . ANTERIOR AND POSTERIOR VAGINAL REPAIR  09/14/2019   [facility]  . BLADDER SUSPENSION  09/14/2019   [facility]  . BREAST BIOPSY Left 2001   benign, marked placed  . BREAST BIOPSY Right 2001   positive  . BREAST LUMPECTOMY Right    2001 with radiation  . CATARACT EXTRACTION W/PHACO Left 03/15/2020   Procedure: CATARACT EXTRACTION PHACO AND INTRAOCULAR LENS PLACEMENT (IOC) LEFT 10.91 01:01.4;  Surgeon: Birder Robson, MD;  Location: [facility];  Service: Ophthalmology;  Laterality: Left;  . left eye     reconstructive from skin cancer  . VAGINAL HYSTERECTOMY  09/14/2019   [facility]  . VULVECTOMY PARTIAL  2019    There were no vitals filed for this visit.  Subjective Assessment - 04/12/20 1439    Subjective  Patient reports she went on a walk a couple days ago and she had some trouble with it, but that she was able to  garden over the weekend, which she is happy with. Reports today he feels very good with only 1/10    Pertinent History  Pt is a 74 year old female presenting with chronic LBP. Patient reports she cannot pinpoint a time that the pain began, but that it has been worse following hysterectomy in Nov 2020. LBP is R sided and is from R low back to lateral aspect of knee. Denies numbness/tingling, or spasm, reports pain feels "like a bone ache". Reports pain is worse in the morning, and feels very stiff. Pain is also brought on through bending over to empty her cats liter box, getting up from sitting/kneeling in her garden, and lifting thing. Reports walking in the morning helps her pain, and tylenol. Worst pain over past week 7/10 best 1/10. Pt is retired, enjoys Civil engineer, contracting. Pt denies N/V, B&B changes, unexplained weight fluctuation, saddle paresthesia, fever, night sweats, or unrelenting night pain at this time. Denies any falls over past 86months.    How long can you sit comfortably?  unlimited    How long can you stand comfortably?  2 hours    How long can you walk comfortably?  makes pain better    Diagnostic tests  None    Patient Stated Goals  decrease discomfort to be more mobile    Pain Onset  1 to 4 weeks ago       Ther-Ex Qped R hip ext 3x 10 with min cuing for hip ext without lumbar compensation with good carry over Alt reverse lunge x10 with demo and heavy cuing for technique with good carry over; R reverse lunge 2x 10  R hip hike x12 with good cary over of demonstration; + LLE swing 2x 12 Seated piriformis stretch 2x 30sec hold  Manual With patient in prone STM with trigger point release to glute med/max/over deep rotators with only very light pressure tolerated initially, increased to deep pressure and trigger pointing Rolling to ITB for increased blood flow, attempted cross friction at prox insertion, too painful, patient in sidelying with pillow between  knees                          PT Education - 04/12/20 1444    Education Details  therex technique    Person(s) Educated  Patient    Methods  Explanation;Demonstration;Verbal cues    Comprehension  Verbalized understanding;Returned demonstration;Verbal cues required       PT Short Term Goals - 04/04/20 1701      PT SHORT TERM GOAL #1   Title  Pt will be independent with HEP in order to improve strength and decrease back pain in order to improve pain-free function at home and work.    Baseline  04/04/20 HEP given    Time  4    Period  Weeks    Status  New        PT Long Term Goals - 04/04/20 1702      PT LONG TERM GOAL #1   Title  Patient will increase FOTO score to 64 to demonstrate predicted increase in functional mobility to complete ADLs    Baseline  04/04/20 49    Time  8    Period  Weeks    Status  New      PT LONG TERM GOAL #2   Title  Pt will decrease worst R hip pain as reported on NPRS by at least 2 points in order to demonstrate clinically significant reduction in back pain.    Baseline  04/04/20 7/10    Time  8    Period  Weeks    Status  New      PT LONG TERM GOAL #3   Title  Pt will decrease 5TSTS by at least 3 seconds in order to demonstrate clinically significant improvement in LE strength    Baseline  04/04/20 16sec    Time  8    Period  Weeks    Status  New      PT LONG TERM GOAL #4   Title  Patient will demonstrate normalized gait mechanics for efficiency with ambulation to decrease pain    Baseline  04/04/20 Increased R sided shift and WB, minimal trendelenberg. Bilat knee valgus with narrow BOS    Time  8    Period  Weeks    Status  New            Plan - 04/12/20 1505    Clinical Impression Statement  PT continued therex progression for increased hip and core strength with motor control with good success. Patient reports no increased pain following session. Patient with decreased soft tissue tension this session than  last  week, and decreased soft tissue tension from beginning of session to end. Good carry over of cuing for therex. PT will continue progression as able.    Personal Factors and Comorbidities  Age;Comorbidity 1;Comorbidity 2;Fitness;Past/Current Experience;Sex    Comorbidities  HLD, anxiety, hx breast cancer    Examination-Activity Limitations  Squat;Lift;Stairs;Carry;Bend    Examination-Participation Restrictions  Cleaning;Community Activity;Meal Prep    Stability/Clinical Decision Making  Evolving/Moderate complexity    Clinical Decision Making  Moderate    Rehab Potential  Good    PT Frequency  2x / week    PT Duration  8 weeks    PT Treatment/Interventions  ADLs/Self Care Home Management;Electrical Stimulation;Therapeutic activities;Patient/family education;Spinal Manipulations;Joint Manipulations;Passive range of motion;Neuromuscular re-education;Manual techniques;Functional mobility training;Cryotherapy;Ultrasound;Moist Heat;DME Instruction;Iontophoresis 4mg /ml Dexamethasone;Gait training;Stair training;Traction;Balance training;Therapeutic exercise    PT Next Visit Plan  TDN?, motor control squat    PT Home Exercise Plan  piriformis stretch, glute stretch, ice    Consulted and Agree with Plan of Care  Patient       Patient will benefit from skilled therapeutic intervention in order to improve the following deficits and impairments:  Abnormal gait, Decreased balance, Impaired flexibility, Difficulty walking, Decreased endurance, Decreased activity tolerance, Decreased strength, Decreased range of motion, Postural dysfunction, Impaired tone, Increased muscle spasms, Decreased mobility, Increased fascial restricitons, Improper body mechanics, Pain  Visit Diagnosis: Pain in right hip  Stiffness of right hip, not elsewhere classified  Chronic right-sided low back pain without sciatica     Problem List Patient Active Problem List   Diagnosis Date Noted  . Mixed incontinence  08/14/2019  . Anxiety 06/11/2019  . Elevated C-reactive protein 06/11/2019  . Exposure to mold 06/11/2019  . Vitamin B12 deficiency (non anemic) 06/11/2019  . Vitamin D deficiency 06/11/2019  . Aching pain 04/01/2019  . Headache 04/01/2019  . Pelvic floor dysfunction 04/01/2019  . Restless legs 04/01/2019  . Dizziness 03/04/2019  . Insomnia 03/04/2019  . Tinnitus 03/04/2019  . 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 Abdominal bloating 02/20/2018  . Dysuria 02/20/2018  . Uterine prolapse 02/20/2018  . Primary osteoarthritis of left knee 03/25/2017  . Heart palpitations 02/09/2016  . Gastro-esophageal reflux disease without esophagitis 04/18/2015  . Hyperlipidemia 04/18/2015  . Irritable bowel syndrome without diarrhea 04/18/2015  . Atrophic vaginitis 04/18/2015  . Malignant neoplasm of breast ([place]) 04/18/2015  . 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 ANA positive 03/28/2015  . Lichen [id number]   Shelton Silvas PT, DPT Shelton Silvas 04/12/2020, 3:12 PM  [facility] [address], [place], [address] Phone: [phone]   Fax:  [phone]  Name: Dorothy Turner MRN: [id number] Date of Birth: [birth year]

## 2020-04-14 ENCOUNTER — Ambulatory Visit: Payer: Medicare Other | Admitting: Physical Therapy

## 2020-04-14 ENCOUNTER — Other Ambulatory Visit: Payer: Self-pay

## 2020-04-14 ENCOUNTER — Encounter: Payer: Self-pay | Admitting: Physical Therapy

## 2020-04-14 DIAGNOSIS — M545 Low back pain, unspecified: Secondary | ICD-10-CM

## 2020-04-14 DIAGNOSIS — M25551 Pain in right hip: Secondary | ICD-10-CM

## 2020-04-14 DIAGNOSIS — M25651 Stiffness of right hip, not elsewhere classified: Secondary | ICD-10-CM

## 2020-04-14 NOTE — Therapy (Signed)
[facility] [address] [place], [address] Phone: [phone]   Fax:  [phone]  Physical Therapy Treatment  Patient Details  Name: Dorothy Turner MRN: [id number] Date of Birth: Sep 17, 1946 No data recorded  Encounter Date: 04/14/2020  PT End of Session - 04/14/20 1410    Visit Number  4    Number of Visits  17    Date for PT Re-Evaluation  05/30/20    PT Start Time  0150    PT Stop Time  0230    PT Time Calculation (min)  40 min    Activity Tolerance  Patient tolerated treatment well    Behavior During Therapy  [facility] for tasks assessed/performed       Past Medical History:  Diagnosis Date  . Breast cancer ([place]) 2001   right breast ca with lumpectomy and rad tx.   . 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 GERD (gastroesophageal reflux disease)   . Headache    2-3x/week.  Stress  . Paget disease, extra-mammary   . Personal history of radiation therapy 2001   BREAST CA  . Tinnitus   . Vertigo    several months ago    Past Surgical History:  Procedure Laterality Date  . ANTERIOR AND POSTERIOR VAGINAL REPAIR  09/14/2019   [facility]  . BLADDER SUSPENSION  09/14/2019   [facility]  . BREAST BIOPSY Left 2001   benign, marked placed  . BREAST BIOPSY Right 2001   positive  . BREAST LUMPECTOMY Right    2001 with radiation  . CATARACT EXTRACTION W/PHACO Left 03/15/2020   Procedure: CATARACT EXTRACTION PHACO AND INTRAOCULAR LENS PLACEMENT (IOC) LEFT 10.91 01:01.4;  Surgeon: Birder Robson, MD;  Location: [facility];  Service: Ophthalmology;  Laterality: Left;  . left eye     reconstructive from skin cancer  . VAGINAL HYSTERECTOMY  09/14/2019   [facility]  . VULVECTOMY PARTIAL  2019    There were no vitals filed for this visit.  Subjective Assessment - 04/14/20 1350    Subjective  Patient reports her hip is feeling better overall, she thinks PT is helping. Good compliance with HEP. Minimal  1/10 pain today.    Pertinent History  Pt is a 74 year old female presenting with chronic LBP. Patient reports she cannot pinpoint a time that the pain began, but that it has been worse following hysterectomy in Nov 2020. LBP is R sided and is from R low back to lateral aspect of knee. Denies numbness/tingling, or spasm, reports pain feels "like a bone ache". Reports pain is worse in the morning, and feels very stiff. Pain is also brought on through bending over to empty her cats liter box, getting up from sitting/kneeling in her garden, and lifting thing. Reports walking in the morning helps her pain, and tylenol. Worst pain over past week 7/10 best 1/10. Pt is retired, enjoys Civil engineer, contracting. Pt denies N/V, B&B changes, unexplained weight fluctuation, saddle paresthesia, fever, night sweats, or unrelenting night pain at this time. Denies any falls over past 42months.    How long can you sit comfortably?  unlimited    How long can you stand comfortably?  2 hours    How long can you walk comfortably?  makes pain better    Diagnostic tests  None    Patient Stated Goals  decrease discomfort to be more mobile    Pain Onset  1 to 4 weeks ago       Ther-Ex Qped R hip ext x10; RLE and LUE lift 2x 10 with min cuing for hip ext without lumbar compensation with good carry over Bridge from bosu (hardside) 2x 10 with min cuing for "even push" and controlled lower with core engagement with good carry over R [place] split squat 3x 8 with unilateral HHA, cuing initially for proper technique with good carry over R hip hike + LLE swing 2x 12 with good carry over from last session    Manual With patient in prone STM withtrigger point releaseto glute med/max/over deep rotators with only very light pressure tolerated initially, increased to deep pressure and trigger pointing Following Dry Needling: (2/1) 130mm .30 needles placed along the R superior glute max/glute med; and glute max over deep rotators  to decrease increased muscular spasms and trigger points with the patient positioned in prone. Patient was educated on risks and benefits of therapy and verbally consents to PT.  Rolling to ITB for increased blood flow, attempted cross friction at prox insertion, too painful, patient in sidelying with pillow between knees                         PT Education - 04/14/20 1409    Education Details  therex technique, TDN    Person(s) Educated  Patient    Methods  Explanation;Demonstration;Verbal cues    Comprehension  Verbalized understanding;Returned demonstration;Verbal cues required       PT Short Term Goals - 04/04/20 1701      PT SHORT TERM GOAL #1   Title  Pt will be independent with HEP in order to improve strength and decrease back pain in order to improve pain-free function at home and work.    Baseline  04/04/20 HEP given    Time  4    Period  Weeks    Status  New        PT Long Term Goals - 04/04/20 1702      PT LONG TERM GOAL #1   Title  Patient will increase FOTO score to 64 to demonstrate predicted increase in functional mobility to complete ADLs    Baseline  04/04/20 49    Time  8    Period  Weeks    Status  New      PT LONG TERM GOAL #2   Title  Pt will decrease worst R hip pain as reported on NPRS by at least 2 points in order to demonstrate clinically significant reduction in back pain.    Baseline  04/04/20 7/10    Time  8    Period  Weeks    Status  New      PT LONG TERM GOAL #3   Title  Pt will decrease 5TSTS by at least 3 seconds in order to demonstrate clinically significant improvement in LE strength    Baseline  04/04/20 16sec    Time  8    Period  Weeks    Status  New      PT LONG TERM GOAL #4   Title  Patient will demonstrate normalized gait mechanics for efficiency with ambulation to decrease pain    Baseline  04/04/20 Increased R sided shift and WB, minimal trendelenberg. Bilat knee valgus with narrow BOS    Time  8     Period  Weeks    Status  New  Plan - 04/14/20 1429    Clinical Impression Statement  PT continued therex progression for increased hip and core stability with good success. PT introduced TDN with manual techniques with good success. Patient is able to comply with all cuing for proper technique of therex with good motivation. PT will continue progression as able.    Personal Factors and Comorbidities  Age;Comorbidity 1;Comorbidity 2;Fitness;Past/Current Experience;Sex    Comorbidities  HLD, anxiety, hx breast cancer    Examination-Activity Limitations  Squat;Lift;Stairs;Carry;Bend    Examination-Participation Restrictions  Cleaning;Community Activity;Meal Prep    Stability/Clinical Decision Making  Evolving/Moderate complexity    Clinical Decision Making  Moderate    Rehab Potential  Good    PT Frequency  2x / week    PT Duration  8 weeks    PT Treatment/Interventions  ADLs/Self Care Home Management;Electrical Stimulation;Therapeutic activities;Patient/family education;Spinal Manipulations;Joint Manipulations;Passive range of motion;Neuromuscular re-education;Manual techniques;Functional mobility training;Cryotherapy;Ultrasound;Moist Heat;DME Instruction;Iontophoresis 4mg /ml Dexamethasone;Gait training;Stair training;Traction;Balance training;Therapeutic exercise    PT Next Visit Plan  continue motor control, hip and    PT Home Exercise Plan  piriformis stretch, glute stretch, ice    Consulted and Agree with Plan of Care  Patient       Patient will benefit from skilled therapeutic intervention in order to improve the following deficits and impairments:  Abnormal gait, Decreased balance, Impaired flexibility, Difficulty walking, Decreased endurance, Decreased activity tolerance, Decreased strength, Decreased range of motion, Postural dysfunction, Impaired tone, Increased muscle spasms, Decreased mobility, Increased fascial restricitons, Improper body mechanics, Pain  Visit  Diagnosis: Pain in right hip  Stiffness of right hip, not elsewhere classified  Chronic right-sided low back pain without sciatica     Problem List Patient Active Problem List   Diagnosis Date Noted  . Mixed incontinence 08/14/2019  . Anxiety 06/11/2019  . Elevated C-reactive protein 06/11/2019  . Exposure to mold 06/11/2019  . Vitamin B12 deficiency (non anemic) 06/11/2019  . Vitamin D deficiency 06/11/2019  . Aching pain 04/01/2019  . Headache 04/01/2019  . Pelvic floor dysfunction 04/01/2019  . Restless legs 04/01/2019  . Dizziness 03/04/2019  . Insomnia 03/04/2019  . Tinnitus 03/04/2019  . 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 Abdominal bloating 02/20/2018  . Dysuria 02/20/2018  . Uterine prolapse 02/20/2018  . Primary osteoarthritis of left knee 03/25/2017  . Heart palpitations 02/09/2016  . Gastro-esophageal reflux disease without esophagitis 04/18/2015  . Hyperlipidemia 04/18/2015  . Irritable bowel syndrome without diarrhea 04/18/2015  . Atrophic vaginitis 04/18/2015  . Malignant neoplasm of breast ([place]) 04/18/2015  . 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 ANA positive 03/28/2015  . Lichen [id number]   Shelton Silvas PT, DPT Shelton Silvas 04/14/2020, 2:58 PM  [facility] [address] [place], [address] Phone: [phone]   Fax:  [phone]  Name: Dorothy Turner MRN: [id number] Date of Birth: 07/09/1946

## 2020-04-18 ENCOUNTER — Ambulatory Visit: Payer: Medicare Other | Admitting: Physical Therapy

## 2020-04-18 ENCOUNTER — Other Ambulatory Visit: Payer: Self-pay

## 2020-04-18 ENCOUNTER — Encounter: Payer: Self-pay | Admitting: Physical Therapy

## 2020-04-18 DIAGNOSIS — M545 Low back pain, unspecified: Secondary | ICD-10-CM

## 2020-04-18 DIAGNOSIS — M25551 Pain in right hip: Secondary | ICD-10-CM

## 2020-04-18 DIAGNOSIS — M25651 Stiffness of right hip, not elsewhere classified: Secondary | ICD-10-CM

## 2020-04-18 NOTE — Therapy (Signed)
[facility] [address], [place], [address] Phone: [phone]   Fax:  [phone]  Physical Therapy Treatment  Patient Details  Name: Dorothy Turner MRN: [id number] Date of Birth: 11/16/1945 No data recorded  Encounter Date: 04/18/2020  PT End of Session - 04/18/20 1137    Visit Number  5    Number of Visits  17    Date for PT Re-Evaluation  05/30/20    PT Start Time  1112    PT Stop Time  1155    PT Time Calculation (min)  43 min    Activity Tolerance  Patient tolerated treatment well    Behavior During Therapy  [facility] for tasks assessed/performed       Past Medical History:  Diagnosis Date  . Breast cancer ([place]) 2001   right breast ca with lumpectomy and rad tx.   . Cancer ([place])    skin- squamous and basil cell   . GERD (gastroesophageal reflux disease)   . Headache    2-3x/week.  Stress  . Paget disease, extra-mammary   . Personal history of radiation therapy 2001   BREAST CA  . Tinnitus   . Vertigo    several months ago    Past Surgical History:  Procedure Laterality Date  . ANTERIOR AND POSTERIOR VAGINAL REPAIR  09/14/2019   [facility]  . BLADDER SUSPENSION  09/14/2019   [facility]  . BREAST BIOPSY Left 2001   benign, marked placed  . BREAST BIOPSY Right 2001   positive  . BREAST LUMPECTOMY Right    2001 with radiation  . CATARACT EXTRACTION W/PHACO Left 03/15/2020   Procedure: CATARACT EXTRACTION PHACO AND INTRAOCULAR LENS PLACEMENT (IOC) LEFT 10.91 01:01.4;  Surgeon: Birder Robson, MD;  Location: [facility];  Service: Ophthalmology;  Laterality: Left;  . left eye     reconstructive from skin cancer  . VAGINAL HYSTERECTOMY  09/14/2019   [facility]  . VULVECTOMY PARTIAL  2019    There were no vitals filed for this visit.  Subjective Assessment - 04/18/20 1117    Subjective  Reports her hip is feeling better overall. Reports the R hip has been a little achy and her L knee has been  bothering her, but she does have an appointment scheduled for pain management for this.    Pertinent History  Pt is a 74 year old female presenting with chronic LBP. Patient reports she cannot pinpoint a time that the pain began, but that it has been worse following hysterectomy in Nov 2020. LBP is R sided and is from R low back to lateral aspect of knee. Denies numbness/tingling, or spasm, reports pain feels "like a bone ache". Reports pain is worse in the morning, and feels very stiff. Pain is also brought on through bending over to empty her cats liter box, getting up from sitting/kneeling in her garden, and lifting thing. Reports walking in the morning helps her pain, and tylenol. Worst pain over past week 7/10 best 1/10. Pt is retired, enjoys Civil engineer, contracting. Pt denies N/V, B&B changes, unexplained weight fluctuation, saddle paresthesia, fever, night sweats, or unrelenting night pain at this time. Denies any falls over past 3months.    How long can you sit comfortably?  unlimited    How long can you stand comfortably?  2 hours    How long can you walk comfortably?  makes pain better    Diagnostic tests  None    Patient  Stated Goals  decrease discomfort to be more mobile    Pain Onset  1 to 4 weeks ago         Ther-Ex Alt birddog 2x 10 with min cuing for core activation with good carry over following Very minimal squat from bosu ball hardside 2x 8 with min cuing for technique  RLE lateral step up onto 6in step 3x 8 with demo and cuing initially for proper technique/LE alignment with good carry over R reverse lunge with GTB for hip ER and abd activation 3x 6 with good carry over following cuing to correct this in standing phase   Manual With patient in prone STM withtrigger point releaseto glute med/max/over deep rotators with only very light pressure tolerated initially, increased to deep pressure and trigger pointing Following Dry Needling: (2/1) 156mm .30 needles placed along  the R superior glute max/glute med; and glute max over deep rotators to decrease increased muscular spasms and trigger points with the patient positioned in prone. Patient was educated on risks and benefits of therapy and verbally consents to PT.  Rolling to ITB for increased blood flow, attempted cross friction at prox insertion, too painful, patient in sidelying with pillow between knees                         PT Education - 04/18/20 1121    Education Details  therex technique, TDN    Person(s) Educated  Patient    Methods  Explanation;Demonstration;Verbal cues    Comprehension  Verbalized understanding;Returned demonstration;Verbal cues required       PT Short Term Goals - 04/04/20 1701      PT SHORT TERM GOAL #1   Title  Pt will be independent with HEP in order to improve strength and decrease back pain in order to improve pain-free function at home and work.    Baseline  04/04/20 HEP given    Time  4    Period  Weeks    Status  New        PT Long Term Goals - 04/04/20 1702      PT LONG TERM GOAL #1   Title  Patient will increase FOTO score to 64 to demonstrate predicted increase in functional mobility to complete ADLs    Baseline  04/04/20 49    Time  8    Period  Weeks    Status  New      PT LONG TERM GOAL #2   Title  Pt will decrease worst R hip pain as reported on NPRS by at least 2 points in order to demonstrate clinically significant reduction in back pain.    Baseline  04/04/20 7/10    Time  8    Period  Weeks    Status  New      PT LONG TERM GOAL #3   Title  Pt will decrease 5TSTS by at least 3 seconds in order to demonstrate clinically significant improvement in LE strength    Baseline  04/04/20 16sec    Time  8    Period  Weeks    Status  New      PT LONG TERM GOAL #4   Title  Patient will demonstrate normalized gait mechanics for efficiency with ambulation to decrease pain    Baseline  04/04/20 Increased R sided shift and WB, minimal  trendelenberg. Bilat knee valgus with narrow BOS    Time  8    Period  Weeks    Status  New            Plan - 04/18/20 1159    Clinical Impression Statement  PT continued therex progression for increased hip and core stability with good success. Patient is improving motor control, and utilizing correct muscle activation to accomplish therex, and complies with cuing for therex technique. Pt continues to respond well to manual with TDN as well.  PT will continue progression as able.    Personal Factors and Comorbidities  Age;Comorbidity 1;Comorbidity 2;Fitness;Past/Current Experience;Sex    Comorbidities  HLD, anxiety, hx breast cancer    Examination-Activity Limitations  Squat;Lift;Stairs;Carry;Bend    Examination-Participation Restrictions  Cleaning;Community Activity;Meal Prep    Stability/Clinical Decision Making  Evolving/Moderate complexity    Clinical Decision Making  Moderate    Rehab Potential  Good    PT Frequency  2x / week    PT Duration  8 weeks    PT Treatment/Interventions  ADLs/Self Care Home Management;Electrical Stimulation;Therapeutic activities;Patient/family education;Spinal Manipulations;Joint Manipulations;Passive range of motion;Neuromuscular re-education;Manual techniques;Functional mobility training;Cryotherapy;Ultrasound;Moist Heat;DME Instruction;Iontophoresis 4mg /ml Dexamethasone;Gait training;Stair training;Traction;Balance training;Therapeutic exercise    PT Next Visit Plan  continue motor control, hip and    PT Home Exercise Plan  piriformis stretch, glute stretch, ice    Consulted and Agree with Plan of Care  Patient       Patient will benefit from skilled therapeutic intervention in order to improve the following deficits and impairments:  Abnormal gait, Decreased balance, Impaired flexibility, Difficulty walking, Decreased endurance, Decreased activity tolerance, Decreased strength, Decreased range of motion, Postural dysfunction, Impaired tone,  Increased muscle spasms, Decreased mobility, Increased fascial restricitons, Improper body mechanics, Pain  Visit Diagnosis: Pain in right hip  Stiffness of right hip, not elsewhere classified  Chronic right-sided low back pain without sciatica     Problem List Patient Active Problem List   Diagnosis Date Noted  . Mixed incontinence 08/14/2019  . Anxiety 06/11/2019  . Elevated C-reactive protein 06/11/2019  . Exposure to mold 06/11/2019  . Vitamin B12 deficiency (non anemic) 06/11/2019  . Vitamin D deficiency 06/11/2019  . Aching pain 04/01/2019  . Headache 04/01/2019  . Pelvic floor dysfunction 04/01/2019  . Restless legs 04/01/2019  . Dizziness 03/04/2019  . Insomnia 03/04/2019  . Tinnitus 03/04/2019  . 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 Abdominal bloating 02/20/2018  . Dysuria 02/20/2018  . Uterine prolapse 02/20/2018  . Primary osteoarthritis of left knee 03/25/2017  . Heart palpitations 02/09/2016  . Gastro-esophageal reflux disease without esophagitis 04/18/2015  . Hyperlipidemia 04/18/2015  . Irritable bowel syndrome without diarrhea 04/18/2015  . Atrophic vaginitis 04/18/2015  . Malignant neoplasm of breast ([place]) 04/18/2015  . 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 ANA positive 03/28/2015  . Lichen [id number]   Shelton Silvas PT, DPT Shelton Silvas 04/18/2020, 12:01 PM  [facility] [address] [place], [address] Phone: [phone]   Fax:  [phone]  Name: Dorothy Turner MRN: [id number] Date of Birth: [birth year]

## 2020-04-20 ENCOUNTER — Encounter: Payer: Medicare Other | Admitting: Physical Therapy

## 2020-04-22 ENCOUNTER — Ambulatory Visit: Payer: Medicare Other | Admitting: Physical Therapy

## 2020-04-22 ENCOUNTER — Encounter: Payer: Self-pay | Admitting: Physical Therapy

## 2020-04-22 ENCOUNTER — Other Ambulatory Visit: Payer: Self-pay

## 2020-04-22 DIAGNOSIS — M25651 Stiffness of right hip, not elsewhere classified: Secondary | ICD-10-CM

## 2020-04-22 DIAGNOSIS — M545 Low back pain, unspecified: Secondary | ICD-10-CM

## 2020-04-22 DIAGNOSIS — M25551 Pain in right hip: Secondary | ICD-10-CM

## 2020-04-22 NOTE — Therapy (Signed)
[facility] [address], [place], [address] Phone: [phone]   Fax:  [phone]  Physical Therapy Treatment  Patient Details  Name: Dorothy Turner MRN: [id number] Date of Birth: [birth year] No data recorded  Encounter Date: 04/22/2020   PT End of Session - 04/22/20 1007    Visit Number 6    Number of Visits 17    Date for PT Re-Evaluation 05/30/20    PT Start Time 1004    PT Stop Time 1045    PT Time Calculation (min) 41 min    Activity Tolerance Patient tolerated treatment well    Behavior During Therapy [facility] for tasks assessed/performed           Past Medical History:  Diagnosis Date  . Breast cancer ([place]) 2001   right breast ca with lumpectomy and rad tx.   . 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 GERD (gastroesophageal reflux disease)   . Headache    2-3x/week.  Stress  . Paget disease, extra-mammary   . Personal history of radiation therapy 2001   BREAST CA  . Tinnitus   . Vertigo    several months ago    Past Surgical History:  Procedure Laterality Date  . ANTERIOR AND POSTERIOR VAGINAL REPAIR  09/14/2019   [facility]  . BLADDER SUSPENSION  09/14/2019   [facility]  . BREAST BIOPSY Left 2001   benign, marked placed  . BREAST BIOPSY Right 2001   positive  . BREAST LUMPECTOMY Right    2001 with radiation  . CATARACT EXTRACTION W/PHACO Left 03/15/2020   Procedure: CATARACT EXTRACTION PHACO AND INTRAOCULAR LENS PLACEMENT (IOC) LEFT 10.91 01:01.4;  Surgeon: Birder Robson, MD;  Location: [facility];  Service: Ophthalmology;  Laterality: Left;  . left eye     reconstructive from skin cancer  . VAGINAL HYSTERECTOMY  09/14/2019   [facility]  . VULVECTOMY PARTIAL  2019    There were no vitals filed for this visit.   Subjective Assessment - 04/22/20 1005    Subjective Patient reports her hip is feeling better today, 1/10 pain. Reports it was worse when she woke up this morning,  but that she is usually stiff when she gets started in the morning.    Pertinent History Pt is a 74 year old female presenting with chronic LBP. Patient reports she cannot pinpoint a time that the pain began, but that it has been worse following hysterectomy in Nov 2020. LBP is R sided and is from R low back to lateral aspect of knee. Denies numbness/tingling, or spasm, reports pain feels "like a bone ache". Reports pain is worse in the morning, and feels very stiff. Pain is also brought on through bending over to empty her cats liter box, getting up from sitting/kneeling in her garden, and lifting thing. Reports walking in the morning helps her pain, and tylenol. Worst pain over past week 7/10 best 1/10. Pt is retired, enjoys Civil engineer, contracting. Pt denies N/V, B&B changes, unexplained weight fluctuation, saddle paresthesia, fever, night sweats, or unrelenting night pain at this time. Denies any falls over past 46months.    How long can you sit comfortably? unlimited    How long can you stand comfortably? 2 hours    How long can you walk comfortably? makes pain better    Diagnostic tests None    Patient Stated Goals decrease discomfort to be more mobile  Pain Onset 1 to 4 weeks ago              Ther-Ex Alt birddog 2x 10 with min cuing for core activation with good carry over following Very minimal squat from bosu ball hardside 3x 10 with min cuing initially for proper muscle activation with good carry over Bilat MATRIX hip abd 40# 3x 8 with min cuing with standing on RLE RLE [place] split squat 3x 6 with HHA to establish balance at initiation of therex, good carry over without following R reverse lunge x10; with L hip flex x10; with decent carry over of maintaining LE alignment without external cuing   Manual With patient in prone STM withtrigger point releaseto glute max/over deep rotators, and (increased time on) glute med; good tolerance With patient in sidelying rolling to ITB  with increased pressure as tolerated                           PT Education - 04/22/20 1007    Education Details therex form/technique    Person(s) Educated Patient    Methods Explanation;Demonstration;Verbal cues    Comprehension Verbalized understanding;Returned demonstration;Verbal cues required            PT Short Term Goals - 04/04/20 1701      PT SHORT TERM GOAL #1   Title Pt will be independent with HEP in order to improve strength and decrease back pain in order to improve pain-free function at home and work.    Baseline 04/04/20 HEP given    Time 4    Period Weeks    Status New             PT Long Term Goals - 04/04/20 1702      PT LONG TERM GOAL #1   Title Patient will increase FOTO score to 64 to demonstrate predicted increase in functional mobility to complete ADLs    Baseline 04/04/20 49    Time 8    Period Weeks    Status New      PT LONG TERM GOAL #2   Title Pt will decrease worst R hip pain as reported on NPRS by at least 2 points in order to demonstrate clinically significant reduction in back pain.    Baseline 04/04/20 7/10    Time 8    Period Weeks    Status New      PT LONG TERM GOAL #3   Title Pt will decrease 5TSTS by at least 3 seconds in order to demonstrate clinically significant improvement in LE strength    Baseline 04/04/20 16sec    Time 8    Period Weeks    Status New      PT LONG TERM GOAL #4   Title Patient will demonstrate normalized gait mechanics for efficiency with ambulation to decrease pain    Baseline 04/04/20 Increased R sided shift and WB, minimal trendelenberg. Bilat knee valgus with narrow BOS    Time 8    Period Weeks    Status New                 Plan - 04/22/20 1044    Clinical Impression Statement PT continued therex progression for increase hip and core stability with good success. PT forewent TDN to gauge pain response with decreased passive treatment. PT progressed RLE balance and  stability demand with good tolerance, no increased pain, only good muscle fatigue. PT will continue progression as  able.    Personal Factors and Comorbidities Age;Comorbidity 1;Comorbidity 2;Fitness;Past/Current Experience;Sex    Comorbidities HLD, anxiety, hx breast cancer    Examination-Activity Limitations Squat;Lift;Stairs;Carry;Bend    Examination-Participation Restrictions Cleaning;Community Activity;Meal Prep    Stability/Clinical Decision Making Evolving/Moderate complexity    Clinical Decision Making Moderate    Rehab Potential Good    PT Frequency 2x / week    PT Duration 8 weeks    PT Treatment/Interventions ADLs/Self Care Home Management;Electrical Stimulation;Therapeutic activities;Patient/family education;Spinal Manipulations;Joint Manipulations;Passive range of motion;Neuromuscular re-education;Manual techniques;Functional mobility training;Cryotherapy;Ultrasound;Moist Heat;DME Instruction;Iontophoresis 4mg /ml Dexamethasone;Gait training;Stair training;Traction;Balance training;Therapeutic exercise    PT Next Visit Plan continue motor control, hip and    PT Home Exercise Plan piriformis stretch, glute stretch, ice    Consulted and Agree with Plan of Care Patient           Patient will benefit from skilled therapeutic intervention in order to improve the following deficits and impairments:  Abnormal gait, Decreased balance, Impaired flexibility, Difficulty walking, Decreased endurance, Decreased activity tolerance, Decreased strength, Decreased range of motion, Postural dysfunction, Impaired tone, Increased muscle spasms, Decreased mobility, Increased fascial restricitons, Improper body mechanics, Pain  Visit Diagnosis: Pain in right hip  Stiffness of right hip, not elsewhere classified  Chronic right-sided low back pain without sciatica     Problem List Patient Active Problem List   Diagnosis Date Noted  . Mixed incontinence 08/14/2019  . Anxiety 06/11/2019  .  Elevated C-reactive protein 06/11/2019  . Exposure to mold 06/11/2019  . Vitamin B12 deficiency (non anemic) 06/11/2019  . Vitamin D deficiency 06/11/2019  . Aching pain 04/01/2019  . Headache 04/01/2019  . Pelvic floor dysfunction 04/01/2019  . Restless legs 04/01/2019  . Dizziness 03/04/2019  . Insomnia 03/04/2019  . Tinnitus 03/04/2019  . 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 Abdominal bloating 02/20/2018  . Dysuria 02/20/2018  . Uterine prolapse 02/20/2018  . Primary osteoarthritis of left knee 03/25/2017  . Heart palpitations 02/09/2016  . Gastro-esophageal reflux disease without esophagitis 04/18/2015  . Hyperlipidemia 04/18/2015  . Irritable bowel syndrome without diarrhea 04/18/2015  . Atrophic vaginitis 04/18/2015  . Malignant neoplasm of breast ([place]) 04/18/2015  . 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 ANA positive 03/28/2015  . Lichen [id number]/16/3845   Durwin Reges DPT Durwin Reges 04/22/2020, 10:48 AM  [facility] [address], [place], [address] Phone: [phone]   Fax:  [phone]  Name: Dorothy Turner MRN: [id number] Date of Birth: 01-22-1946

## 2020-04-25 ENCOUNTER — Encounter: Payer: Self-pay | Admitting: Physical Therapy

## 2020-04-25 ENCOUNTER — Other Ambulatory Visit: Payer: Self-pay

## 2020-04-25 ENCOUNTER — Ambulatory Visit: Payer: Medicare Other | Admitting: Physical Therapy

## 2020-04-25 DIAGNOSIS — M25651 Stiffness of right hip, not elsewhere classified: Secondary | ICD-10-CM

## 2020-04-25 DIAGNOSIS — M25551 Pain in right hip: Secondary | ICD-10-CM

## 2020-04-25 DIAGNOSIS — G8929 Other chronic pain: Secondary | ICD-10-CM

## 2020-04-25 NOTE — Therapy (Signed)
[facility] [address] [place], [address] Phone: [phone]   Fax:  [phone]  Physical Therapy Treatment  Patient Details  Name: Dorothy Turner MRN: [id number] Date of Birth: 29-Aug-1946 No data recorded  Encounter Date: 04/25/2020   PT End of Session - 04/25/20 1120    Visit Number 7    Number of Visits 17    Date for PT Re-Evaluation 05/30/20    PT Start Time 1115    PT Stop Time 1157    PT Time Calculation (min) 42 min    Activity Tolerance Patient tolerated treatment well    Behavior During Therapy [facility] for tasks assessed/performed           Past Medical History:  Diagnosis Date  . Breast cancer ([place]) 2001   right breast ca with lumpectomy and rad tx.   . Cancer ([place])    skin- squamous and basil cell   . GERD (gastroesophageal reflux disease)   . Headache    2-3x/week.  Stress  . Paget disease, extra-mammary   . Personal history of radiation therapy 2001   BREAST CA  . Tinnitus   . Vertigo    several months ago    Past Surgical History:  Procedure Laterality Date  . ANTERIOR AND POSTERIOR VAGINAL REPAIR  09/14/2019   [facility]  . BLADDER SUSPENSION  09/14/2019   [facility]  . BREAST BIOPSY Left 2001   benign, marked placed  . BREAST BIOPSY Right 2001   positive  . BREAST LUMPECTOMY Right    2001 with radiation  . CATARACT EXTRACTION W/PHACO Left 03/15/2020   Procedure: CATARACT EXTRACTION PHACO AND INTRAOCULAR LENS PLACEMENT (IOC) LEFT 10.91 01:01.4;  Surgeon: Birder Robson, MD;  Location: [facility];  Service: Ophthalmology;  Laterality: Left;  . left eye     reconstructive from skin cancer  . VAGINAL HYSTERECTOMY  09/14/2019   [facility]  . VULVECTOMY PARTIAL  2019    There were no vitals filed for this visit.   Subjective Assessment - 04/25/20 1116    Subjective Pt reports 5/10 pain this morning; reporting that mornings are the"worst" for her hip pain. Some compliance with  HEP, pt prefers stretching; pt has not performed strengthening exercises not related to pain.    Pertinent History Pt is a 74 year old female presenting with chronic LBP. Patient reports she cannot pinpoint a time that the pain began, but that it has been worse following hysterectomy in Nov 2020. LBP is R sided and is from R low back to lateral aspect of knee. Denies numbness/tingling, or spasm, reports pain feels "like a bone ache". Reports pain is worse in the morning, and feels very stiff. Pain is also brought on through bending over to empty her cats liter box, getting up from sitting/kneeling in her garden, and lifting thing. Reports walking in the morning helps her pain, and tylenol. Worst pain over past week 7/10 best 1/10. Pt is retired, enjoys Civil engineer, contracting. Pt denies N/V, B&B changes, unexplained weight fluctuation, saddle paresthesia, fever, night sweats, or unrelenting night pain at this time. Denies any falls over past 31months.    How long can you sit comfortably? unlimited    How long can you stand comfortably? 2 hours    How long can you walk comfortably? makes pain better    Diagnostic tests None    Patient Stated Goals decrease discomfort to be more mobile    Currently  in Pain? Yes    Pain Score 5     Pain Location Hip    Pain Orientation Right    Pain Onset 1 to 4 weeks ago            Ther-Ex Glute bridge 1x10 with min cueing for glute and core activation with good carry over Glute bridge with RTB 2x10 Alt birddog 2x 10 with min cuing for core activation and level pelvic stabilization with good carry over  RLE reverse lunge 1x10 on stable surface, 2x8 on foam with HHA with cueing for decreased knee valgus and increased glute activation with good carry over Mini squat on foam 2x8 with cueing for decreased knee flexion and valgus, and increased glute activation with decent carry over   Manual STM with trigger point release to glute med/max/TFL Dry Needling:  (2/1)111mm .30needles placed along the R superior glute max/glute med; and glute max over deep rotators todecrease increased muscular spasms and trigger points with the patient positioned in prone. Patient was educated on risks and benefits of therapy and verbally consents to PT. With patient in sidelying with pillow between knees rolling to ITB with increased pressure as tolerated   Visit for knee injection scheduled on 6/28       PT Education - 04/25/20 1120    Education Details therex form/technique    Person(s) Educated Patient    Methods Explanation;Demonstration;Verbal cues    Comprehension Verbalized understanding;Returned demonstration;Verbal cues required            PT Short Term Goals - 04/04/20 1701      PT SHORT TERM GOAL #1   Title Pt will be independent with HEP in order to improve strength and decrease back pain in order to improve pain-free function at home and work.    Baseline 04/04/20 HEP given    Time 4    Period Weeks    Status New             PT Long Term Goals - 04/04/20 1702      PT LONG TERM GOAL #1   Title Patient will increase FOTO score to 64 to demonstrate predicted increase in functional mobility to complete ADLs    Baseline 04/04/20 49    Time 8    Period Weeks    Status New      PT LONG TERM GOAL #2   Title Pt will decrease worst R hip pain as reported on NPRS by at least 2 points in order to demonstrate clinically significant reduction in back pain.    Baseline 04/04/20 7/10    Time 8    Period Weeks    Status New      PT LONG TERM GOAL #3   Title Pt will decrease 5TSTS by at least 3 seconds in order to demonstrate clinically significant improvement in LE strength    Baseline 04/04/20 16sec    Time 8    Period Weeks    Status New      PT LONG TERM GOAL #4   Title Patient will demonstrate normalized gait mechanics for efficiency with ambulation to decrease pain    Baseline 04/04/20 Increased R sided shift and WB, minimal  trendelenberg. Bilat knee valgus with narrow BOS    Time 8    Period Weeks    Status New                 Plan - 04/25/20 1121    Clinical Impression Statement PT  continued to utilize manual with TDN to decrease muscle tension and improve mobility with success. PT continued therex for increased hip and core stability with good success and no increase in hip pain. Pt reported increaesd L knee pain with mini squat that reduced with cueing for glute activation and increased hip hinge.  Pt requires min cueing for decreased knee valgus and flexion throughout therex. PT will continue to progression as able.    Personal Factors and Comorbidities Age;Comorbidity 1;Comorbidity 2;Fitness;Past/Current Experience;Sex    Comorbidities HLD, anxiety, hx breast cancer    Examination-Activity Limitations Squat;Lift;Stairs;Carry;Bend    Examination-Participation Restrictions Cleaning;Community Activity;Meal Prep    Stability/Clinical Decision Making Evolving/Moderate complexity    Clinical Decision Making Moderate    Rehab Potential Good    PT Frequency 2x / week    PT Duration 8 weeks    PT Treatment/Interventions ADLs/Self Care Home Management;Electrical Stimulation;Therapeutic activities;Patient/family education;Spinal Manipulations;Joint Manipulations;Passive range of motion;Neuromuscular re-education;Manual techniques;Functional mobility training;Cryotherapy;Ultrasound;Moist Heat;DME Instruction;Iontophoresis 4mg /ml Dexamethasone;Gait training;Stair training;Traction;Balance training;Therapeutic exercise    PT Next Visit Plan continue motor control, hip and core    PT Home Exercise Plan piriformis stretch, glute stretch, ice, glute bridge with RTB    Consulted and Agree with Plan of Care Patient           Patient will benefit from skilled therapeutic intervention in order to improve the following deficits and impairments:  Abnormal gait, Decreased balance, Impaired flexibility, Difficulty  walking, Decreased endurance, Decreased activity tolerance, Decreased strength, Decreased range of motion, Postural dysfunction, Impaired tone, Increased muscle spasms, Decreased mobility, Increased fascial restricitons, Improper body mechanics, Pain  Visit Diagnosis: Pain in right hip  Stiffness of right hip, not elsewhere classified  Chronic right-sided low back pain without sciatica     Problem List Patient Active Problem List   Diagnosis Date Noted  . Mixed incontinence 08/14/2019  . Anxiety 06/11/2019  . Elevated C-reactive protein 06/11/2019  . Exposure to mold 06/11/2019  . Vitamin B12 deficiency (non anemic) 06/11/2019  . Vitamin D deficiency 06/11/2019  . Aching pain 04/01/2019  . Headache 04/01/2019  . Pelvic floor dysfunction 04/01/2019  . Restless legs 04/01/2019  . Dizziness 03/04/2019  . Insomnia 03/04/2019  . Tinnitus 03/04/2019  . 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 Abdominal bloating 02/20/2018  . Dysuria 02/20/2018  . Uterine prolapse 02/20/2018  . Primary osteoarthritis of left knee 03/25/2017  . Heart palpitations 02/09/2016  . Gastro-esophageal reflux disease without esophagitis 04/18/2015  . Hyperlipidemia 04/18/2015  . Irritable bowel syndrome without diarrhea 04/18/2015  . Atrophic vaginitis 04/18/2015  . Malignant neoplasm of breast ([place]) 04/18/2015  . 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 ANA positive 03/28/2015  . Lichen 67/34/1937   Durwin Reges DPT Chinita Greenland, SPT Durwin Reges 04/25/2020, 1:06 PM  [facility] [address] [place], [address] Phone: [phone]   Fax:  [phone]  Name: MIATA CULBRETH MRN: [id number] Date of Birth: 10-Sep-1946

## 2020-04-26 ENCOUNTER — Ambulatory Visit
Admission: RE | Admit: 2020-04-26 | Discharge: 2020-04-26 | Disposition: A | Discharge status: Home / Self Care | Payer: Medicare Other | Source: Ambulatory Visit | Attending: *Deleted | Admitting: *Deleted

## 2020-04-26 DIAGNOSIS — R921 Mammographic calcification found on diagnostic imaging of breast: Secondary | ICD-10-CM | POA: Insufficient documentation

## 2020-04-26 DIAGNOSIS — R928 Other abnormal and inconclusive findings on diagnostic imaging of breast: Secondary | ICD-10-CM | POA: Diagnosis not present

## 2020-04-28 ENCOUNTER — Encounter: Payer: Self-pay | Admitting: Physical Therapy

## 2020-04-28 ENCOUNTER — Ambulatory Visit: Payer: Medicare Other | Admitting: Physical Therapy

## 2020-04-28 ENCOUNTER — Other Ambulatory Visit: Payer: Self-pay

## 2020-04-28 DIAGNOSIS — M25551 Pain in right hip: Secondary | ICD-10-CM

## 2020-04-28 DIAGNOSIS — M25651 Stiffness of right hip, not elsewhere classified: Secondary | ICD-10-CM

## 2020-04-28 DIAGNOSIS — M545 Low back pain, unspecified: Secondary | ICD-10-CM

## 2020-04-28 NOTE — Therapy (Signed)
[facility] [address] [place], [address] Phone: [phone]   Fax:  [phone]  Physical Therapy Treatment  Patient Details  Name: Dorothy Turner MRN: [id number] Date of Birth: [birth year] No data recorded  Encounter Date: 04/28/2020   PT End of Session - 04/28/20 1307    Visit Number 8    Number of Visits 17    Date for PT Re-Evaluation 05/30/20    PT Start Time 0100    PT Stop Time 0140    PT Time Calculation (min) 40 min    Activity Tolerance Patient tolerated treatment well    Behavior During Therapy [facility] for tasks assessed/performed           Past Medical History:  Diagnosis Date   Breast cancer ([place]) 2001   right breast ca with lumpectomy and rad tx.    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  Stress   Paget disease, extra-mammary    Personal history of radiation therapy 2001   BREAST CA   Tinnitus    Vertigo    several months ago    Past Surgical History:  Procedure Laterality Date   ANTERIOR AND POSTERIOR VAGINAL REPAIR  09/14/2019   [facility]   BLADDER SUSPENSION  09/14/2019   [facility]   BREAST BIOPSY Left 2001   benign, marked placed   BREAST BIOPSY Right 2001   positive   BREAST LUMPECTOMY Right    2001 with radiation   CATARACT EXTRACTION W/PHACO Left 03/15/2020   Procedure: CATARACT EXTRACTION PHACO AND INTRAOCULAR LENS PLACEMENT (IOC) LEFT 10.91 01:01.4;  Surgeon: Birder Robson, MD;  Location: [facility];  Service: Ophthalmology;  Laterality: Left;   left eye     reconstructive from skin cancer   VAGINAL HYSTERECTOMY  09/14/2019   [facility]   VULVECTOMY PARTIAL  2019    There were no vitals filed for this visit.   Subjective Assessment - 04/28/20 1304    Subjective Pt reports that her hip pain is feeling a little better overall 2/10 pain today. Reports that she is a little sore  from working in her garden today.    Pertinent History Pt is a 74 year old female presenting with chronic LBP. Patient reports she cannot pinpoint a time that the pain began, but that it has been worse following hysterectomy in Nov 2020. LBP is R sided and is from R low back to lateral aspect of knee. Denies numbness/tingling, or spasm, reports pain feels "like a bone ache". Reports pain is worse in the morning, and feels very stiff. Pain is also brought on through bending over to empty her cats liter box, getting up from sitting/kneeling in her garden, and lifting thing. Reports walking in the morning helps her pain, and tylenol. Worst pain over past week 7/10 best 1/10. Pt is retired, enjoys Civil engineer, contracting. Pt denies N/V, B&B changes, unexplained weight fluctuation, saddle paresthesia, fever, night sweats, or unrelenting night pain at this time. Denies any falls over past 71months.    How long can you sit comfortably? unlimited    How long can you stand comfortably? 2 hours    How long can you walk comfortably? makes pain better    Diagnostic tests None    Patient Stated Goals decrease discomfort to be more mobile    Pain Onset 1 to 4  weeks ago           Ther-Ex R SL glute bridge 3x 6 attempted on L side which is much easier than R R lateral step up onto 6in step 4x 10 with demo and cuing for technique with glute activation with decent carry over Squat to chair with quick stand 3x 5/6/6 with heavy cuing to prevent R increased wt bearing    Manual STM with trigger point release to glute med/max/TFL (increased time spent at glute med/TFL) With patient in sidelying with pillow between knees rolling to ITB with increased pressure as tolerated                            PT Education - 04/28/20 1307    Education Details therex form    Person(s) Educated Patient    Methods Explanation;Demonstration;Verbal cues    Comprehension Verbalized understanding;Returned  demonstration;Verbal cues required            PT Short Term Goals - 04/04/20 1701      PT SHORT TERM GOAL #1   Title Pt will be independent with HEP in order to improve strength and decrease back pain in order to improve pain-free function at home and work.    Baseline 04/04/20 HEP given    Time 4    Period Weeks    Status New             PT Long Term Goals - 04/04/20 1702      PT LONG TERM GOAL #1   Title Patient will increase FOTO score to 64 to demonstrate predicted increase in functional mobility to complete ADLs    Baseline 04/04/20 49    Time 8    Period Weeks    Status New      PT LONG TERM GOAL #2   Title Pt will decrease worst R hip pain as reported on NPRS by at least 2 points in order to demonstrate clinically significant reduction in back pain.    Baseline 04/04/20 7/10    Time 8    Period Weeks    Status New      PT LONG TERM GOAL #3   Title Pt will decrease 5TSTS by at least 3 seconds in order to demonstrate clinically significant improvement in LE strength    Baseline 04/04/20 16sec    Time 8    Period Weeks    Status New      PT LONG TERM GOAL #4   Title Patient will demonstrate normalized gait mechanics for efficiency with ambulation to decrease pain    Baseline 04/04/20 Increased R sided shift and WB, minimal trendelenberg. Bilat knee valgus with narrow BOS    Time 8    Period Weeks    Status New                 Plan - 04/28/20 1341    Clinical Impression Statement PT continued to utilize manual techniques without TDN in order to increase independence by decreasing passive treatment with good success. Patient continues to have decreased pain and tension overall, and is able to tolerate therex progression well with good carry over of cuing and demonstrations for proper techniques with planned rest breaks throughout session. PT will continue progression as able.    Personal Factors and Comorbidities Age;Comorbidity 1;Comorbidity  2;Fitness;Past/Current Experience;Sex    Comorbidities HLD, anxiety, hx breast cancer    Examination-Activity Limitations Squat;Lift;Stairs;Carry;[employer]  Examination-Participation Restrictions Cleaning;Community Activity;Meal Prep    Stability/Clinical Decision Making Evolving/Moderate complexity    Clinical Decision Making Moderate    Rehab Potential Good    PT Frequency 2x / week    PT Duration 8 weeks    PT Treatment/Interventions ADLs/Self Care Home Management;Electrical Stimulation;Therapeutic activities;Patient/family education;Spinal Manipulations;Joint Manipulations;Passive range of motion;Neuromuscular re-education;Manual techniques;Functional mobility training;Cryotherapy;Ultrasound;Moist Heat;DME Instruction;Iontophoresis 4mg /ml Dexamethasone;Gait training;Stair training;Traction;Balance training;Therapeutic exercise    PT Next Visit Plan continue motor control, hip and core    PT Home Exercise Plan piriformis stretch, glute stretch, ice, glute bridge with RTB    Consulted and Agree with Plan of Care Patient           Patient will benefit from skilled therapeutic intervention in order to improve the following deficits and impairments:  Abnormal gait, Decreased balance, Impaired flexibility, Difficulty walking, Decreased endurance, Decreased activity tolerance, Decreased strength, Decreased range of motion, Postural dysfunction, Impaired tone, Increased muscle spasms, Decreased mobility, Increased fascial restricitons, Improper body mechanics, Pain  Visit Diagnosis: Pain in right hip  Stiffness of right hip, not elsewhere classified  Chronic right-sided low back pain without sciatica     Problem List Patient Active Problem List   Diagnosis Date Noted   Mixed incontinence 08/14/2019   Anxiety 06/11/2019   Elevated C-reactive protein 06/11/2019   Exposure to mold 06/11/2019   Vitamin B12 deficiency (non anemic) 06/11/2019   Vitamin D deficiency 06/11/2019    Aching pain 04/01/2019   Headache 04/01/2019   Pelvic floor dysfunction 04/01/2019   Restless legs 04/01/2019   Dizziness 03/04/2019   Insomnia 03/04/2019   Tinnitus 03/04/2019   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 [place], [address] Phone: [phone]   Fax:  [phone]  Name: KABELLA CASSIDY MRN: [id number] Date of Birth: 19-Nov-1945

## 2020-05-03 ENCOUNTER — Other Ambulatory Visit: Payer: Self-pay

## 2020-05-03 ENCOUNTER — Ambulatory Visit: Payer: Medicare Other | Admitting: Physical Therapy

## 2020-05-03 ENCOUNTER — Encounter: Payer: Self-pay | Admitting: Physical Therapy

## 2020-05-03 DIAGNOSIS — M25651 Stiffness of right hip, not elsewhere classified: Secondary | ICD-10-CM

## 2020-05-03 DIAGNOSIS — G8929 Other chronic pain: Secondary | ICD-10-CM

## 2020-05-03 DIAGNOSIS — M25551 Pain in right hip: Secondary | ICD-10-CM | POA: Diagnosis not present

## 2020-05-03 NOTE — Therapy (Signed)
[facility] [address] [place], [address] Phone: [phone]   Fax:  [phone]  Physical Therapy Treatment  Patient Details  Name: Dorothy Turner MRN: [id number] Date of Birth: 1946-10-14 No data recorded  Encounter Date: 05/03/2020   PT End of Session - 05/03/20 1320    Visit Number 9    Number of Visits 17    Date for PT Re-Evaluation 05/30/20    PT Start Time 0100    PT Stop Time 0140    PT Time Calculation (min) 40 min    Activity Tolerance Patient tolerated treatment well    Behavior During Therapy [facility] for tasks assessed/performed           Past Medical History:  Diagnosis Date   Breast cancer ([place]) 2001   right breast ca with lumpectomy and rad tx.    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  Stress   Paget disease, extra-mammary    Personal history of radiation therapy 2001   BREAST CA   Tinnitus    Vertigo    several months ago    Past Surgical History:  Procedure Laterality Date   ANTERIOR AND POSTERIOR VAGINAL REPAIR  09/14/2019   [facility]   BLADDER SUSPENSION  09/14/2019   [facility]   BREAST BIOPSY Left 2001   benign, marked placed   BREAST BIOPSY Right 2001   positive   BREAST LUMPECTOMY Right    2001 with radiation   CATARACT EXTRACTION W/PHACO Left 03/15/2020   Procedure: CATARACT EXTRACTION PHACO AND INTRAOCULAR LENS PLACEMENT (IOC) LEFT 10.91 01:01.4;  Surgeon: Birder Robson, MD;  Location: [facility];  Service: Ophthalmology;  Laterality: Left;   left eye     reconstructive from skin cancer   VAGINAL HYSTERECTOMY  09/14/2019   [facility]   VULVECTOMY PARTIAL  2019    There were no vitals filed for this visit.   Subjective Assessment - 05/03/20 1303    Subjective Patient reports she did well weaning from TDN. Is continuing to feel better overall, reporting 2/10 R hip pain,  that is worse when she stands from prolonged sitting.    Pertinent History Pt is a 74 year old female presenting with chronic LBP. Patient reports she cannot pinpoint a time that the pain began, but that it has been worse following hysterectomy in Nov 2020. LBP is R sided and is from R low back to lateral aspect of knee. Denies numbness/tingling, or spasm, reports pain feels "like a bone ache". Reports pain is worse in the morning, and feels very stiff. Pain is also brought on through bending over to empty her cats liter box, getting up from sitting/kneeling in her garden, and lifting thing. Reports walking in the morning helps her pain, and tylenol. Worst pain over past week 7/10 best 1/10. Pt is retired, enjoys Civil engineer, contracting. Pt denies N/V, B&B changes, unexplained weight fluctuation, saddle paresthesia, fever, night sweats, or unrelenting night pain at this time. Denies any falls over past 34months.    How long can you sit comfortably? unlimited    How long can you stand comfortably? 2 hours    How long can you walk comfortably? makes pain better    Diagnostic tests None    Patient Stated Goals decrease discomfort to be more mobile    Pain Onset 1 to 4  weeks ago           Ther-Ex Squat to chair with quick stand 3x 8 with heavy cuing to prevent R increased wt bearing, and for glute  Lateral band walks RTB 68ft L and R x3 rounds with cuing for maintained mini squat position, and neutral knee position without hip IR and knee valgus with good carry over Supine unilateral hip ER with CL stabilization GTB 2x 10 with min cuing for L stabilization with RLE motion with good carry over ER bridge GTB  Manual STM withtrigger point releaseto glute med/max/TFL (increased time spent at glute med/TFL) Dry Needling: (2) 189mm .30 needles placed along the R superior glute fibers to decrease increased muscular spasms and trigger points with the patient positioned in prone. Patient was educated on  risks and benefits of therapy and verbally consents to PT.  With patient in sidelyingwith pillow between kneesrolling to ITB with increased pressure as tolerated                          PT Education - 05/03/20 1319    Education Details therex form    Person(s) Educated Patient    Methods Explanation;Demonstration;Verbal cues    Comprehension Verbalized understanding;Returned demonstration;Verbal cues required            PT Short Term Goals - 04/04/20 1701      PT SHORT TERM GOAL #1   Title Pt will be independent with HEP in order to improve strength and decrease back pain in order to improve pain-free function at home and work.    Baseline 04/04/20 HEP given    Time 4    Period Weeks    Status New             PT Long Term Goals - 04/04/20 1702      PT LONG TERM GOAL #1   Title Patient will increase FOTO score to 64 to demonstrate predicted increase in functional mobility to complete ADLs    Baseline 04/04/20 49    Time 8    Period Weeks    Status New      PT LONG TERM GOAL #2   Title Pt will decrease worst R hip pain as reported on NPRS by at least 2 points in order to demonstrate clinically significant reduction in back pain.    Baseline 04/04/20 7/10    Time 8    Period Weeks    Status New      PT LONG TERM GOAL #3   Title Pt will decrease 5TSTS by at least 3 seconds in order to demonstrate clinically significant improvement in LE strength    Baseline 04/04/20 16sec    Time 8    Period Weeks    Status New      PT LONG TERM GOAL #4   Title Patient will demonstrate normalized gait mechanics for efficiency with ambulation to decrease pain    Baseline 04/04/20 Increased R sided shift and WB, minimal trendelenberg. Bilat knee valgus with narrow BOS    Time 8    Period Weeks    Status New                 Plan - 05/03/20 1323    Clinical Impression Statement PT continued therex progression for increased hip strength/stability to  improve LE alignment, motor control and strength with good success. Patient continues to respond well to manual techniques with decreased tension and good  localized twitch response to TDN. Patient is able to comply with all cuing for proper therex techniques, with good motivation, and no increased pain throughout session. PT will continue progression as able.    Personal Factors and Comorbidities Age;Comorbidity 1;Comorbidity 2;Fitness;Past/Current Experience;Sex    Comorbidities HLD, anxiety, hx breast cancer    Examination-Activity Limitations Squat;Lift;Stairs;Carry;Bend    Examination-Participation Restrictions Cleaning;Community Activity;Meal Prep    Stability/Clinical Decision Making Evolving/Moderate complexity    Clinical Decision Making Moderate    Rehab Potential Good    PT Frequency 2x / week    PT Duration 8 weeks    PT Treatment/Interventions ADLs/Self Care Home Management;Electrical Stimulation;Therapeutic activities;Patient/family education;Spinal Manipulations;Joint Manipulations;Passive range of motion;Neuromuscular re-education;Manual techniques;Functional mobility training;Cryotherapy;Ultrasound;Moist Heat;DME Instruction;Iontophoresis 4mg /ml Dexamethasone;Gait training;Stair training;Traction;Balance training;Therapeutic exercise    PT Next Visit Plan continue motor control, hip and core    PT Home Exercise Plan piriformis stretch, glute stretch, ice, glute bridge with RTB    Consulted and Agree with Plan of Care Patient           Patient will benefit from skilled therapeutic intervention in order to improve the following deficits and impairments:  Abnormal gait, Decreased balance, Impaired flexibility, Difficulty walking, Decreased endurance, Decreased activity tolerance, Decreased strength, Decreased range of motion, Postural dysfunction, Impaired tone, Increased muscle spasms, Decreased mobility, Increased fascial restricitons, Improper body mechanics, Pain  Visit  Diagnosis: Pain in right hip  Stiffness of right hip, not elsewhere classified  Chronic right-sided low back pain without sciatica     Problem List Patient Active Problem List   Diagnosis Date Noted   Mixed incontinence 08/14/2019   Anxiety 06/11/2019   Elevated C-reactive protein 06/11/2019   Exposure to mold 06/11/2019   Vitamin B12 deficiency (non anemic) 06/11/2019   Vitamin D deficiency 06/11/2019   Aching pain 04/01/2019   Headache 04/01/2019   Pelvic floor dysfunction 04/01/2019   Restless legs 04/01/2019   Dizziness 03/04/2019   Insomnia 03/04/2019   Tinnitus 03/04/2019   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 [place], [address] Phone: [phone]   Fax:  [phone]  Name: Dorothy Turner MRN: [id number] Date of Birth: [birth year]

## 2020-05-11 ENCOUNTER — Encounter: Payer: Self-pay | Admitting: Physical Therapy

## 2020-05-11 ENCOUNTER — Ambulatory Visit: Payer: Medicare Other | Admitting: Physical Therapy

## 2020-05-11 ENCOUNTER — Other Ambulatory Visit: Payer: Self-pay

## 2020-05-11 DIAGNOSIS — M25551 Pain in right hip: Secondary | ICD-10-CM

## 2020-05-11 DIAGNOSIS — M25651 Stiffness of right hip, not elsewhere classified: Secondary | ICD-10-CM

## 2020-05-11 DIAGNOSIS — G8929 Other chronic pain: Secondary | ICD-10-CM

## 2020-05-11 NOTE — Therapy (Addendum)
[facility] [address] [place], [address] Phone: [phone]   Fax:  [phone]  Physical Therapy Treatment/Discharge Summary Reporting Period 04/04/20 - 05/11/20  Patient Details  Name: Dorothy Turner MRN: [id number] Date of Birth: 31-Jul-1946 No data recorded  Encounter Date: 05/11/2020   PT End of Session - 05/11/20 1504    Visit Number 10    Number of Visits 17    Date for PT Re-Evaluation 05/30/20    PT Start Time 0300    PT Stop Time 0340    PT Time Calculation (min) 40 min    Activity Tolerance Patient tolerated treatment well    Behavior During Therapy [facility] for tasks assessed/performed           Past Medical History:  Diagnosis Date  . Breast cancer ([place]) 2001   right breast ca with lumpectomy and rad tx.   . Cancer ([place])    skin- squamous and basil cell   . GERD (gastroesophageal reflux disease)   . Headache    2-3x/week.  Stress  . Paget disease, extra-mammary   . Personal history of radiation therapy 2001   BREAST CA  . Tinnitus   . Vertigo    several months ago    Past Surgical History:  Procedure Laterality Date  . ANTERIOR AND POSTERIOR VAGINAL REPAIR  09/14/2019   [facility]  . BLADDER SUSPENSION  09/14/2019   [facility]  . BREAST BIOPSY Left 2001   benign, marked placed  . BREAST BIOPSY Right 2001   positive  . BREAST LUMPECTOMY Right    2001 with radiation  . CATARACT EXTRACTION W/PHACO Left 03/15/2020   Procedure: CATARACT EXTRACTION PHACO AND INTRAOCULAR LENS PLACEMENT (IOC) LEFT 10.91 01:01.4;  Surgeon: Birder Robson, MD;  Location: [facility];  Service: Ophthalmology;  Laterality: Left;  . left eye     reconstructive from skin cancer  . VAGINAL HYSTERECTOMY  09/14/2019   [facility]  . VULVECTOMY PARTIAL  2019    There were no vitals filed for this visit.   Subjective Assessment - 05/11/20 1500    Subjective Reports she had a L knee injection and this feels much  better. She is feeling better overall, reporting no pain today.    Pertinent History Pt is a 74 year old female presenting with chronic LBP. Patient reports she cannot pinpoint a time that the pain began, but that it has been worse following hysterectomy in Nov 2020. LBP is R sided and is from R low back to lateral aspect of knee. Denies numbness/tingling, or spasm, reports pain feels "like a bone ache". Reports pain is worse in the morning, and feels very stiff. Pain is also brought on through bending over to empty her cats liter box, getting up from sitting/kneeling in her garden, and lifting thing. Reports walking in the morning helps her pain, and tylenol. Worst pain over past week 7/10 best 1/10. Pt is retired, enjoys Civil engineer, contracting. Pt denies N/V, B&B changes, unexplained weight fluctuation, saddle paresthesia, fever, night sweats, or unrelenting night pain at this time. Denies any falls over past 62month.    How long can you sit comfortably? unlimited    How long can you stand comfortably? 2 hours    How long can you walk comfortably? makes pain better    Diagnostic tests None    Patient Stated Goals decrease discomfort to be more mobile    Pain Onset 1 to 4 weeks  ago           Ther-Ex 5xSTS x2 trials, best time 12sec  PT reviewed the following HEP with patient with patient able to demonstrate a set of the following with min cuing for correction needed. PT educated patient on parameters of therex (how/when to inc/decrease intensity, frequency, rep/set range, stretch hold time, and purpose of therex) with verbalized understanding.   Access Code: [email or web address] Squat with Chair Touch - 1 x daily - 2-3 x weekly - 3 sets - 10 reps Reverse Lunge - 1 x daily - 2-3 x weekly - 3 sets - 10 reps Bridge with Hip Abduction and Resistance - 1 x daily - 2-3 x weekly - 3 sets - 10 reps Single Leg Bridge - 1 x daily - 2-3 x weekly - 3 sets - 10 reps Side Stepping with Resistance at Thighs - 1 x daily  - 2-3 x weekly - 3 sets - 10 reps Hip Abduction with Resistance Loop - 1 x daily - 2-3 x weekly - 3 sets - 10 reps                            PT Education - 05/11/20 1503    Education Details therex form/technique    Person(s) Educated Patient    Methods Explanation;Demonstration;Verbal cues;Tactile cues;Handout    Comprehension Verbalized understanding;Returned demonstration;Verbal cues required;Tactile cues required            PT Short Term Goals - 04/04/20 1701      PT SHORT TERM GOAL #1   Title Pt will be independent with HEP in order to improve strength and decrease back pain in order to improve pain-free function at home and work.    Baseline 04/04/20 HEP given    Time 4    Period Weeks    Status New             PT Long Term Goals - 05/11/20 1504      PT LONG TERM GOAL #1   Title Patient will increase FOTO score to 64 to demonstrate predicted increase in functional mobility to complete ADLs    Baseline 05/11/20 71 04/04/20 49    Time 8    Period Weeks    Status Achieved      PT LONG TERM GOAL #2   Title Pt will decrease worst R hip pain as reported on NPRS by at least 2 points in order to demonstrate clinically significant reduction in back pain.    Baseline 05/11/20 2/10 during the day after she gets out of bed 04/04/20 7/10    Time 8    Period Weeks    Status Achieved      PT LONG TERM GOAL #3   Title Pt will decrease 5TSTS by at least 3 seconds in order to demonstrate clinically significant improvement in LE strength    Baseline 05/11/20 12.5sec 04/04/20 16sec    Time 8    Period Weeks    Status Achieved      PT LONG TERM GOAL #4   Title Patient will demonstrate normalized gait mechanics for efficiency with ambulation to decrease pain    Baseline 6/30/21normal ambulation 04/04/20 Increased R sided shift and WB, minimal trendelenberg. Bilat knee valgus with narrow BOS    Time 8    Period Weeks    Status Achieved  Plan - 05/11/20 1554    Clinical Impression Statement PT reassessed goals this session, where patient has met all goals to d/c PT to HEP. Patient is able to verbalize and demonstrate understanding of all HEP therex and recommendations and is given clinic contact info should any further questions or concerns arise.    Personal Factors and Comorbidities Age;Comorbidity 1;Comorbidity 2;Fitness;Past/Current Experience;Sex    Comorbidities HLD, anxiety, hx breast cancer    Examination-Activity Limitations Squat;Lift;Stairs;Carry;Bend    Examination-Participation Restrictions Cleaning;Community Activity;Meal Prep    Stability/Clinical Decision Making Evolving/Moderate complexity    Clinical Decision Making Moderate    Rehab Potential Good    PT Frequency 2x / week    PT Duration 8 weeks    PT Treatment/Interventions ADLs/Self Care Home Management;Electrical Stimulation;Therapeutic activities;Patient/family education;Spinal Manipulations;Joint Manipulations;Passive range of motion;Neuromuscular re-education;Manual techniques;Functional mobility training;Cryotherapy;Ultrasound;Moist Heat;DME Instruction;Iontophoresis 68m/ml Dexamethasone;Gait training;Stair training;Traction;Balance training;Therapeutic exercise    PT Next Visit Plan continue motor control, hip and core    PT Home Exercise Plan [email or web address]    Consulted and Agree with Plan of Care Patient           Patient will benefit from skilled therapeutic intervention in order to improve the following deficits and impairments:  Abnormal gait, Decreased balance, Impaired flexibility, Difficulty walking, Decreased endurance, Decreased activity tolerance, Decreased strength, Decreased range of motion, Postural dysfunction, Impaired tone, Increased muscle spasms, Decreased mobility, Increased fascial restricitons, Improper body mechanics, Pain  Visit Diagnosis: Pain in right hip  Stiffness of right hip, not elsewhere  classified  Chronic right-sided low back pain without sciatica     Problem List Patient Active Problem List   Diagnosis Date Noted  . Mixed incontinence 08/14/2019  . Anxiety 06/11/2019  . Elevated C-reactive protein 06/11/2019  . Exposure to mold 06/11/2019  . Vitamin B12 deficiency (non anemic) 06/11/2019  . Vitamin D deficiency 06/11/2019  . Aching pain 04/01/2019  . Headache 04/01/2019  . Pelvic floor dysfunction 04/01/2019  . Restless legs 04/01/2019  . Dizziness 03/04/2019  . Insomnia 03/04/2019  . Tinnitus 03/04/2019  . 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 Abdominal bloating 02/20/2018  . Dysuria 02/20/2018  . Uterine prolapse 02/20/2018  . Primary osteoarthritis of left knee 03/25/2017  . Heart palpitations 02/09/2016  . Gastro-esophageal reflux disease without esophagitis 04/18/2015  . Hyperlipidemia 04/18/2015  . Irritable bowel syndrome without diarrhea 04/18/2015  . Atrophic vaginitis 04/18/2015  . [place] 04/18/2015  . 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 ANA positive 03/28/2015  . Lichen [id number]/[id number]  CDurwin RegesDPT CDurwin Reges6/30/2021, 3:56 PM  [facility] [address] [place] [address]Phone: [phone]  Fax:  [phone] Name: JLUCYNDA ROSANOMRN: [id number] of Birth: 803-05-1946

## 2020-05-13 ENCOUNTER — Ambulatory Visit: Payer: Medicare Other | Admitting: Physical Therapy

## 2020-05-18 ENCOUNTER — Ambulatory Visit: Payer: Medicare Other | Admitting: Physical Therapy

## 2020-05-20 ENCOUNTER — Other Ambulatory Visit: Payer: Self-pay | Admitting: Gastroenterology

## 2020-05-20 ENCOUNTER — Other Ambulatory Visit (HOSPITAL_COMMUNITY): Payer: Self-pay | Admitting: Gastroenterology

## 2020-05-20 DIAGNOSIS — R634 Abnormal weight loss: Secondary | ICD-10-CM

## 2020-05-23 ENCOUNTER — Encounter: Payer: Medicare Other | Admitting: Physical Therapy

## 2020-05-25 ENCOUNTER — Encounter: Payer: Medicare Other | Admitting: Physical Therapy

## 2020-05-31 ENCOUNTER — Encounter: Payer: Medicare Other | Admitting: Physical Therapy

## 2020-06-03 ENCOUNTER — Encounter: Payer: Medicare Other | Admitting: Physical Therapy

## 2020-06-04 ENCOUNTER — Other Ambulatory Visit: Payer: Self-pay

## 2020-06-04 ENCOUNTER — Ambulatory Visit
Admission: RE | Admit: 2020-06-04 | Discharge: 2020-06-04 | Disposition: A | Discharge status: Home / Self Care | Payer: Medicare Other | Source: Ambulatory Visit | Attending: Gastroenterology | Admitting: Gastroenterology

## 2020-06-04 DIAGNOSIS — R634 Abnormal weight loss: Secondary | ICD-10-CM | POA: Diagnosis present

## 2020-06-04 MED ORDER — GADOBUTROL 1 MMOL/ML IV SOLN
7.0000 mL | Freq: Once | INTRAVENOUS | Status: AC | PRN
  Administered 2020-06-04: 7 mL via INTRAVENOUS

## 2020-06-07 ENCOUNTER — Encounter: Payer: Medicare Other | Admitting: Physical Therapy

## 2020-06-08 ENCOUNTER — Other Ambulatory Visit: Payer: Self-pay

## 2020-06-08 ENCOUNTER — Other Ambulatory Visit
Admission: RE | Admit: 2020-06-08 | Discharge: 2020-06-08 | Disposition: A | Discharge status: Home / Self Care | Payer: Medicare Other | Source: Ambulatory Visit | Attending: Gastroenterology | Admitting: Gastroenterology

## 2020-06-08 DIAGNOSIS — Z20822 Contact with and (suspected) exposure to covid-19: Secondary | ICD-10-CM | POA: Insufficient documentation

## 2020-06-08 DIAGNOSIS — Z01812 Encounter for preprocedural laboratory examination: Secondary | ICD-10-CM | POA: Diagnosis present

## 2020-06-08 LAB — SARS CORONAVIRUS 2 (TAT 6-24 HRS): SARS Coronavirus 2: NEGATIVE

## 2020-06-09 ENCOUNTER — Encounter: Payer: Medicare Other | Admitting: Physical Therapy

## 2020-06-09 ENCOUNTER — Encounter: Payer: Self-pay | Admitting: *Deleted

## 2020-06-10 ENCOUNTER — Encounter
Admission: RE | Disposition: A | Discharge status: Home / Self Care | Payer: Self-pay | Source: Home / Self Care | Attending: Gastroenterology

## 2020-06-10 ENCOUNTER — Other Ambulatory Visit: Payer: Self-pay

## 2020-06-10 ENCOUNTER — Ambulatory Visit: Payer: Medicare Other | Admitting: Anesthesiology

## 2020-06-10 ENCOUNTER — Encounter: Payer: Self-pay | Admitting: *Deleted

## 2020-06-10 ENCOUNTER — Ambulatory Visit
Admission: RE | Admit: 2020-06-10 | Discharge: 2020-06-10 | Disposition: A | Discharge status: Home / Self Care | Payer: Medicare Other | Attending: Gastroenterology | Admitting: Gastroenterology

## 2020-06-10 DIAGNOSIS — Z923 Personal history of irradiation: Secondary | ICD-10-CM | POA: Diagnosis not present

## 2020-06-10 DIAGNOSIS — K219 Gastro-esophageal reflux disease without esophagitis: Secondary | ICD-10-CM | POA: Diagnosis not present

## 2020-06-10 DIAGNOSIS — D175 Benign lipomatous neoplasm of intra-abdominal organs: Secondary | ICD-10-CM | POA: Diagnosis not present

## 2020-06-10 DIAGNOSIS — Z85828 Personal history of other malignant neoplasm of skin: Secondary | ICD-10-CM | POA: Diagnosis not present

## 2020-06-10 DIAGNOSIS — K6389 Other specified diseases of intestine: Secondary | ICD-10-CM | POA: Diagnosis not present

## 2020-06-10 DIAGNOSIS — Z1211 Encounter for screening for malignant neoplasm of colon: Secondary | ICD-10-CM | POA: Insufficient documentation

## 2020-06-10 DIAGNOSIS — Z853 Personal history of malignant neoplasm of breast: Secondary | ICD-10-CM | POA: Insufficient documentation

## 2020-06-10 DIAGNOSIS — Z800 Family history of malignant neoplasm of digestive organs: Secondary | ICD-10-CM | POA: Insufficient documentation

## 2020-06-10 DIAGNOSIS — F419 Anxiety disorder, unspecified: Secondary | ICD-10-CM | POA: Insufficient documentation

## 2020-06-10 DIAGNOSIS — Z79899 Other long term (current) drug therapy: Secondary | ICD-10-CM | POA: Insufficient documentation

## 2020-06-10 DIAGNOSIS — Z87891 Personal history of nicotine dependence: Secondary | ICD-10-CM | POA: Diagnosis not present

## 2020-06-10 HISTORY — DX: Irritable bowel syndrome, unspecified: K58.9

## 2020-06-10 HISTORY — DX: Malignant neoplasm of unspecified site of unspecified female breast: C50.919

## 2020-06-10 HISTORY — DX: Plantar fascial fibromatosis: M72.2

## 2020-06-10 HISTORY — DX: Hyperlipidemia, unspecified: E78.5

## 2020-06-10 HISTORY — DX: Postmenopausal atrophic vaginitis: N95.2

## 2020-06-10 HISTORY — PX: COLONOSCOPY WITH PROPOFOL: SHX5780

## 2020-06-10 SURGERY — COLONOSCOPY WITH PROPOFOL
Anesthesia: General

## 2020-06-10 MED ORDER — PROPOFOL 10 MG/ML IV BOLUS
INTRAVENOUS | Status: DC | PRN
  Administered 2020-06-10: 20 mg via INTRAVENOUS
  Administered 2020-06-10 (×2): 10 mg via INTRAVENOUS

## 2020-06-10 MED ORDER — PROPOFOL 500 MG/50ML IV EMUL
INTRAVENOUS | Status: AC
  Filled 2020-06-10: qty 50

## 2020-06-10 MED ORDER — LIDOCAINE HCL (PF) 2 % IJ SOLN
INTRAMUSCULAR | Status: AC
  Filled 2020-06-10: qty 5

## 2020-06-10 MED ORDER — PROPOFOL 500 MG/50ML IV EMUL
INTRAVENOUS | Status: DC | PRN
  Administered 2020-06-10: 50 ug/kg/min via INTRAVENOUS

## 2020-06-10 MED ORDER — PHENYLEPHRINE HCL (PRESSORS) 10 MG/ML IV SOLN
INTRAVENOUS | Status: AC
  Filled 2020-06-10: qty 1

## 2020-06-10 MED ORDER — FENTANYL CITRATE (PF) 100 MCG/2ML IJ SOLN
INTRAMUSCULAR | Status: AC
  Filled 2020-06-10: qty 2

## 2020-06-10 MED ORDER — LIDOCAINE HCL (PF) 2 % IJ SOLN
INTRAMUSCULAR | Status: DC | PRN
  Administered 2020-06-10: 60 mg

## 2020-06-10 MED ORDER — SODIUM CHLORIDE 0.9 % IV SOLN: INTRAVENOUS | Status: DC

## 2020-06-10 MED ORDER — MIDAZOLAM HCL 2 MG/2ML IJ SOLN
INTRAMUSCULAR | Status: AC
  Filled 2020-06-10: qty 2

## 2020-06-10 MED ORDER — MIDAZOLAM HCL 5 MG/5ML IJ SOLN
INTRAMUSCULAR | Status: DC | PRN
  Administered 2020-06-10: 2 mg via INTRAVENOUS

## 2020-06-10 MED ORDER — FENTANYL CITRATE (PF) 100 MCG/2ML IJ SOLN
INTRAMUSCULAR | Status: DC | PRN
  Administered 2020-06-10 (×4): 25 ug via INTRAVENOUS

## 2020-06-10 NOTE — Interval H&P Note (Signed)
History and Physical Interval Note:  06/10/2020 7:53 AM  Dorothy Turner  has presented today for surgery, with the diagnosis of F HX COLON CA.  The various methods of treatment have been discussed with the patient and family. After consideration of risks, benefits and other options for treatment, the patient has consented to  Procedure(s): COLONOSCOPY WITH PROPOFOL (N/A) as a surgical intervention.  The patient's history has been reviewed, patient examined, no change in status, stable for surgery.  I have reviewed the patient's chart and labs.  Questions were answered to the patient's satisfaction.     Lesly Rubenstein  Ok to proceed to colonoscopy.

## 2020-06-10 NOTE — Anesthesia Preprocedure Evaluation (Signed)
Anesthesia Evaluation  Patient identified by MRN, date of birth, ID band Patient awake    Reviewed: Allergy & Precautions, H&P , NPO status , Patient's Chart, lab work & pertinent test results  Airway Mallampati: II  TM Distance: >3 FB Neck ROM: full    Dental  (+) Teeth Intact   Pulmonary former smoker,    Pulmonary exam normal        Cardiovascular negative cardio ROS Normal cardiovascular exam     Neuro/Psych  Headaches, Anxiety    GI/Hepatic Neg liver ROS, GERD  ,  Endo/Other  negative endocrine ROS  Renal/GU negative Renal ROS     Musculoskeletal   Abdominal   Peds  Hematology negative hematology ROS (+)   Anesthesia Other Findings Left eye does not completely close due to plastic surgery for skin cancer  Past Medical History: No date: Atrophic vaginitis 2001: Breast cancer ([place])     Comment:  right breast ca with lumpectomy and rad tx.  No date: Cancer [facility])     Comment:  skin- squamous and basil cell  No date: GERD (gastroesophageal reflux disease) No date: Headache     Comment:  2-3x/week.  Stress No date: Hyperlipidemia No date: IBS (irritable bowel syndrome) No date: Malignant neoplasm of breast ([place]) No date: Paget disease, extra-mammary 2001: Personal history of radiation therapy     Comment:  BREAST CA No date: Plantar fibromatosis No date: Tinnitus No date: Vertigo     Comment:  several months ago  Past Surgical History: 09/14/2019: ANTERIOR AND POSTERIOR VAGINAL REPAIR     Comment:  [facility] No date: BASAL CELL CARCINOMA EXCISION 09/14/2019: BLADDER SUSPENSION     Comment:  [facility] 2001: BREAST BIOPSY; Left     Comment:  benign, marked placed 2001: BREAST BIOPSY; Right     Comment:  positive No date: BREAST LUMPECTOMY; Right     Comment:  2001 with radiation 03/15/2020: CATARACT EXTRACTION W/PHACO; Left     Comment:  Procedure: CATARACT EXTRACTION PHACO AND INTRAOCULAR               LENS  PLACEMENT (IOC) LEFT 10.91 01:01.4;  Surgeon:               Birder Robson, MD;  Location: [facility];                Service: Ophthalmology;  Laterality: Left; No date: left eye     Comment:  reconstructive from skin cancer 2001: MASTECTOMY; Right     Comment:  partial 09/14/2019: VAGINAL HYSTERECTOMY     Comment:  [facility] 2019: VULVECTOMY PARTIAL  BMI    Body Mass Index: 24.89 kg/m      Reproductive/Obstetrics negative OB ROS                             Anesthesia Physical Anesthesia Plan  ASA: II  Anesthesia Plan: General   Post-op Pain Management:    Induction:   PONV Risk Score and Plan: Propofol infusion and TIVA  Airway Management Planned:   Additional Equipment:   Intra-op Plan:   Post-operative Plan:   Informed Consent: I have reviewed the patients History and Physical, chart, labs and discussed the procedure including the risks, benefits and alternatives for the proposed anesthesia with the patient or authorized representative who has indicated his/her understanding and acceptance.     Dental Advisory Given  Plan Discussed with: Anesthesiologist, CRNA and Surgeon  Anesthesia  Plan Comments: (Will use lubricant and patch for left eye during procedure per pt request)        Anesthesia Quick Evaluation

## 2020-06-10 NOTE — Op Note (Signed)
[facility] Gastroenterology Patient Name: Dorothy Turner Procedure Date: 06/10/2020 7:52 AM MRN: [id number] Account #: [id number] Date of Birth: 05/08/1946 Admit Type: Outpatient Age: 74 Room: [facility] ENDO ROOM 3 Gender: Female Note Status: Finalized Procedure:             Colonoscopy Indications:           Screening patient at increased risk: Family history of                         1st-degree relative with colorectal cancer at age 44                         years (or older) Providers:             Andrey Farmer MD, MD Referring MD:          No Local Md, MD (Referring MD) Medicines:             Monitored Anesthesia Care Complications:         No immediate complications. Procedure:             Pre-Anesthesia Assessment:                        - Prior to the procedure, a History and Physical was                         performed, and patient medications and allergies were                         reviewed. The patient is competent. The risks and                         benefits of the procedure and the sedation options and                         risks were discussed with the patient. All questions                         were answered and informed consent was obtained.                         Patient identification and proposed procedure were                         verified by the physician, the nurse, the anesthetist                         and the technician in the endoscopy suite. Mental                         Status Examination: alert and oriented. Airway                         Examination: normal oropharyngeal airway and neck                         mobility. Respiratory Examination: clear to  auscultation. CV Examination: normal. Prophylactic                         Antibiotics: The patient does not require prophylactic                         antibiotics. Prior Anticoagulants: The patient has                         taken no  previous anticoagulant or antiplatelet                         agents. ASA Grade Assessment: II - A patient with mild                         systemic disease. After reviewing the risks and                         benefits, the patient was deemed in satisfactory                         condition to undergo the procedure. The anesthesia                         plan was to use monitored anesthesia care (MAC).                         Immediately prior to administration of medications,                         the patient was re-assessed for adequacy to receive                         sedatives. The heart rate, respiratory rate, oxygen                         saturations, blood pressure, adequacy of pulmonary                         ventilation, and response to care were monitored                         throughout the procedure. The physical status of the                         patient was re-assessed after the procedure.                        After obtaining informed consent, the colonoscope was                         passed under direct vision. Throughout the procedure,                         the patient's blood pressure, pulse, and oxygen                         saturations were monitored continuously. The  Colonoscope was introduced through the anus and                         advanced to the the cecum, identified by appendiceal                         orifice and ileocecal valve. The colonoscopy was                         technically difficult and complex due to restricted                         mobility of the colon, a redundant colon and                         significant looping. Successful completion of the                         procedure was aided by applying abdominal pressure.                         The patient tolerated the procedure well. The quality                         of the bowel preparation was excellent. Findings:      The perianal and  digital rectal examinations were normal.      The ileocecal valve was moderately lipomatous.      The exam was otherwise without abnormality on direct and retroflexion       views. Impression:            - Lipomatous ileocecal valve.                        - The examination was otherwise normal on direct and                         retroflexion views.                        - No specimens collected. Recommendation:        - Discharge patient to home.                        - Resume previous diet.                        - Continue present medications.                        - Repeat colonoscopy in 10 years for screening                         purposes.                        - Return to referring physician as previously                         scheduled. Procedure Code(s):     --- Professional ---  G0105, Colorectal cancer screening; colonoscopy on                         individual at high risk Diagnosis Code(s):     --- Professional ---                        Z80.0, Family history of malignant neoplasm of                         digestive organs                        K63.89, Other specified diseases of intestine CPT copyright 2019 American Medical Association. All rights reserved. The codes documented in this report are preliminary and upon coder review may  be revised to meet current compliance requirements. Andrey Farmer, MD Andrey Farmer MD, MD 06/10/2020 8:43:50 AM Number of Addenda: 0 Note Initiated On: 06/10/2020 7:52 AM Scope Withdrawal Time: 0 hours 8 minutes 16 seconds  Total Procedure Duration: 0 hours 34 minutes 53 seconds  Estimated Blood Loss:  Estimated blood loss: none.      [facility]

## 2020-06-10 NOTE — H&P (Signed)
Outpatient short stay form Pre-procedure 06/10/2020 7:51 AM Dorothy Miyamoto MD, MPH  Primary Physician: Dr. Sheryle Hail  Reason for visit:  Screening  History of present illness:   74 y/o lady with family history of colon cancer in mother in late 63's/early 70's of age here for screening colon. Two previous colonoscopies were negative. No blood thinners. History of hysterectomy. No new symptoms.    Current Facility-Administered Medications:  .  0.9 %  sodium chloride infusion, , Intravenous, Continuous, Aylen Stradford, Hilton Cork, MD, Last Rate: 20 mL/hr at 06/10/20 0749, New Bag at 06/10/20 0749  Medications Prior to Admission  Medication Sig Dispense Refill Last Dose  . acetaminophen (TYLENOL) 500 MG tablet Take by mouth.   06/10/2020 at 0600  . Cholecalciferol (VITAMIN D3) 2000 units capsule Take by mouth.   Past Week at Unknown time  . gabapentin (NEURONTIN) 300 MG capsule Take 300 mg by mouth at bedtime.   Past Week at Unknown time  . IODINE, KELP, PO Take 2 drops by mouth daily. Liquid iodine  150 mcg   Past Week at Unknown time  . TURMERIC PO by Does not apply route.   Past Week at Unknown time  . zolpidem (AMBIEN) 5 MG tablet Take 5 mg by mouth at bedtime.    Past Month at Unknown time  . amitriptyline (ELAVIL) 10 MG tablet Take 10 mg by mouth at bedtime. (Patient not taking: Reported on 06/10/2020)   Not Taking at Unknown time  . Ascorbic Acid (VITAMIN C) 1000 MG tablet Take 1,000 mg by mouth daily. (Patient not taking: Reported on 06/10/2020)   Not Taking at Unknown time  . b complex vitamins tablet Take 1 tablet by mouth daily. (Patient not taking: Reported on 06/10/2020)   Not Taking at Unknown time  . busPIRone (BUSPAR) 10 MG tablet Take 20 mg by mouth 2 (two) times daily. (Patient not taking: Reported on 06/10/2020)   Not Taking at Unknown time  . OVER THE COUNTER MEDICATION daily. [employer]     . polyethylene glycol (MIRALAX / GLYCOLAX) 17 g packet Take 17 g by mouth daily.      . Probiotic Product (PROBIOTIC DAILY PO) Take by mouth daily. (Patient not taking: Reported on 06/10/2020)   Not Taking at Unknown time     Allergies  Allergen Reactions  . Ciprofloxacin Other (See Comments)    Tendonitis  . Diphenhydramine Hcl Other (See Comments)    Other reaction(s): Makes legs twitch  . Meloxicam Other (See Comments)    Blood in stools     Past Medical History:  Diagnosis Date  . Atrophic vaginitis   . Breast cancer ([place]) 2001   right breast ca with lumpectomy and rad tx.   . Cancer ([place])    skin- squamous and basil cell   . GERD (gastroesophageal reflux disease)   . Headache    2-3x/week.  Stress  . Hyperlipidemia   . IBS (irritable bowel syndrome)   . Malignant neoplasm of breast ([place])   . Paget disease, extra-mammary   . Personal history of radiation therapy 2001   BREAST CA  . Plantar fibromatosis   . Tinnitus   . Vertigo    several months ago    Review of systems:  Otherwise negative.    Physical Exam  Gen: Alert, oriented. Appears stated age.  HEENT: [place]/AT. PERRLA. Lungs: no respiratory distress Abd: soft, benign, no masses. BS+ Ext: No edema. Pulses 2+    Planned procedures: Proceed with  colonoscopy. The patient understands the nature of the planned procedure, indications, risks, alternatives and potential complications including but not limited to bleeding, infection, perforation, damage to internal organs and possible oversedation/side effects from anesthesia. The patient agrees and gives consent to proceed.  Please refer to procedure notes for findings, recommendations and patient disposition/instructions.     Dorothy Miyamoto MD, MPH Gastroenterology 06/10/2020  7:51 AM

## 2020-06-10 NOTE — Transfer of Care (Signed)
Immediate Anesthesia Transfer of Care Note  Patient: Dorothy Turner  Procedure(s) Performed: COLONOSCOPY WITH PROPOFOL (N/A )  Patient Location: PACU  Anesthesia Type:General  Level of Consciousness: sedated  Airway & Oxygen Therapy: Patient Spontanous Breathing  Post-op Assessment: Report given to RN and Post -op Vital signs reviewed and stable  Post vital signs: Reviewed and stable  Last Vitals:  Vitals Value Taken Time  BP    Temp    Pulse    Resp    SpO2      Last Pain:  Vitals:   06/10/20 0734  TempSrc: Temporal  PainSc: 0-No pain         Complications: No complications documented.

## 2020-06-11 NOTE — Anesthesia Postprocedure Evaluation (Signed)
Anesthesia Post Note  Patient: Dorothy Turner  Procedure(s) Performed: COLONOSCOPY WITH PROPOFOL (N/A )  Patient location during evaluation: PACU Anesthesia Type: General Level of consciousness: awake and alert Pain management: pain level controlled Vital Signs Assessment: post-procedure vital signs reviewed and stable Respiratory status: spontaneous breathing, nonlabored ventilation and respiratory function stable Cardiovascular status: blood pressure returned to baseline and stable Postop Assessment: no apparent nausea or vomiting Anesthetic complications: no   No complications documented.   Last Vitals:  Vitals:   06/10/20 0734 06/10/20 0848  BP: (!) 154/80 108/73  Pulse: 80   Resp: 18 19  Temp: (!) 36.4 C   SpO2: 100%     Last Pain:  Vitals:   06/11/20 0748  TempSrc:   PainSc: 0-No pain                 Brett Canales Lachanda Buczek

## 2020-06-24 NOTE — Progress Notes (Signed)
[place]  [phone] Fax:(336) [phone]  ID: LYNISE PORR OB: June 28, 1946  MR#: [id number]  XTK#:[id number]  Patient Care Team: Fabian November., MD as PCP - General  CHIEF COMPLAINT: Hereditary hemochromatosis.  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  Since that time she has donated blood intermittently to maintain decreased iron levels.  She is referred to clinic today for routine evaluation.  She currently feels well and is asymptomatic.  She has no neurologic complaints.  She denies any recent fevers or illnesses.  She has good appetite and denies weight loss.  She has no chest pain, shortness of breath, cough, or hemoptysis.  She denies any nausea, vomiting, constipation, or diarrhea.  She has no urinary complaints.  Patient feels at her baseline and offers no specific complaints today.  REVIEW OF SYSTEMS:   Review of Systems  Constitutional: Negative.  Negative for fever, malaise/fatigue and weight loss.  Respiratory: Negative.  Negative for cough and shortness of breath.   Cardiovascular: Negative.  Negative for chest pain and leg swelling.  Gastrointestinal: Negative.  Negative for abdominal pain.  Genitourinary: Negative.  Negative for dysuria.  Musculoskeletal: Negative.  Negative for back pain.  Skin: Negative.  Negative for rash.  Neurological: Negative.  Negative for dizziness, focal weakness, weakness and headaches.  Psychiatric/Behavioral: Negative.  The patient is not nervous/anxious.     As per HPI. Otherwise, a complete review of systems is negative.  PAST MEDICAL HISTORY: Past Medical History:  Diagnosis Date  . Atrophic vaginitis   . Breast cancer ([place]) 2001   right breast ca with lumpectomy and rad tx.   . 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 GERD (gastroesophageal reflux disease)   . Headache     2-3x/week.  Stress  . Hyperlipidemia   . IBS (irritable bowel syndrome)   . Malignant neoplasm of breast ([place])   . Paget disease, extra-mammary   . Personal history of radiation therapy 2001   BREAST CA  . Plantar fibromatosis   . Tinnitus   . Vertigo    several months ago    PAST SURGICAL HISTORY: Past Surgical History:  Procedure Laterality Date  . ANTERIOR AND POSTERIOR VAGINAL REPAIR  09/14/2019   [facility]  . BASAL CELL CARCINOMA EXCISION    . BLADDER SUSPENSION  09/14/2019   [facility]  . BREAST BIOPSY Left 2001   benign, marked placed  . BREAST BIOPSY Right 2001   positive  . BREAST LUMPECTOMY Right    2001 with radiation  . CATARACT EXTRACTION W/PHACO Left 03/15/2020   Procedure: CATARACT EXTRACTION PHACO AND INTRAOCULAR LENS PLACEMENT (IOC) LEFT 10.91 01:01.4;  Surgeon: Birder Robson, MD;  Location: [place];  Service: Ophthalmology;  Laterality: Left;  . COLONOSCOPY WITH PROPOFOL N/A 06/10/2020   Procedure: COLONOSCOPY WITH PROPOFOL;  Surgeon: Lesly Rubenstein, MD;  Location: [facility] ENDOSCOPY;  Service: Endoscopy;  Laterality: N/A;  . left eye     reconstructive from skin cancer  . MASTECTOMY Right 2001   partial  . VAGINAL HYSTERECTOMY  09/14/2019   [facility]  . VULVECTOMY PARTIAL  2019    FAMILY HISTORY: Family History  Problem Relation Age of Onset  . Breast cancer Cousin        maternal cousin  . Heart disease Mother   . Colon cancer Mother   . Atrial fibrillation Mother   . Hypertension Mother   .  COPD Father     ADVANCED DIRECTIVES (Y/N):  N  HEALTH MAINTENANCE: Social History   Tobacco Use  . Smoking status: Former Smoker    Packs/day: 0.50    Years: 41.50    Pack years: 20.75    Types: Cigarettes    Quit date: 1995    Years since quitting: 26.6  . Smokeless tobacco: Never Used  Vaping Use  . Vaping Use: Never used  Substance Use Topics  . Alcohol use: Yes    Alcohol/week: 10.0 standard drinks    Types: 10 Glasses of wine per week     Comment: 1-2 glasses of wine per night  . Drug use: Never     Colonoscopy:  PAP:  Bone density:  Lipid panel:  Allergies  Allergen Reactions  . Ciprofloxacin Other (See Comments)    Tendonitis  . Diphenhydramine Hcl Other (See Comments)    Other reaction(s): Makes legs twitch  . Meloxicam Other (See Comments)    Blood in stools    Current Outpatient Medications  Medication Sig Dispense Refill  . acetaminophen (TYLENOL) 500 MG tablet Take 500 mg by mouth daily as needed.     . Cholecalciferol (VITAMIN D3) 2000 units capsule Take 2,000 Units by mouth daily.     . folic acid (FOLVITE) 1 MG tablet Take 1 mg by mouth daily.    Marland Kitchen gabapentin (NEURONTIN) 300 MG capsule Take 300 mg by mouth at bedtime.    . Niacin (VITAMIN B-3 PO) Take 1 capsule by mouth daily.    . polyethylene glycol (MIRALAX / GLYCOLAX) 17 g packet Take 17 g by mouth daily.    . TURMERIC PO Take 1 capsule by mouth daily.     Marland Kitchen zolpidem (AMBIEN) 5 MG tablet Take 5 mg by mouth at bedtime as needed.      No current facility-administered medications for this visit.    OBJECTIVE: Vitals:   06/28/20 1458  BP: 132/73  Pulse: 73  Resp: 20  Temp: 98.9 F (37.2 C)  SpO2: 100%     Body mass index is 25.54 kg/m.    ECOG FS:0 - Asymptomatic  General: Well-developed, well-nourished, no acute distress. Eyes: Pink conjunctiva, anicteric sclera. HEENT: Normocephalic, moist mucous membranes. Lungs: No audible wheezing or coughing. Heart: Regular rate and rhythm. Abdomen: Soft, nontender, no obvious distention. Musculoskeletal: No edema, cyanosis, or clubbing. Neuro: Alert, answering all questions appropriately. Cranial nerves grossly intact. Skin: No rashes or petechiae noted. Psych: Normal affect. Lymphatics: No cervical, calvicular, axillary or inguinal LAD.   LAB RESULTS:  Lab Results  Component Value Date   NA 142 03/04/2016   K 3.7 03/04/2016   CL 110 03/04/2016   CO2 22 03/04/2016   GLUCOSE 87  03/04/2016   BUN 14 03/04/2016   CREATININE 0.70 11/26/2018   CALCIUM 9.7 03/04/2016   PROT 6.6 04/25/2017   ALBUMIN 4.6 04/25/2017   AST 24 04/25/2017   ALT 20 04/25/2017   ALKPHOS 82 04/25/2017   BILITOT 0.3 04/25/2017   GFRNONAA >60 03/04/2016   GFRAA >60 03/04/2016    Lab Results  Component Value Date   WBC 9.0 03/04/2016   HGB 14.3 03/04/2016   HCT 41.4 03/04/2016   MCV 104.9 (H) 03/04/2016   PLT 333 03/04/2016     STUDIES: MR ABDOMEN WWO CONTRAST  Result Date: 06/04/2020 CLINICAL DATA:  Hereditary hemochromatosis, weight loss, constipation EXAM: MRI ABDOMEN WITHOUT AND WITH CONTRAST TECHNIQUE: Multiplanar multisequence MR imaging of the abdomen was performed both  before and after the administration of intravenous contrast. CONTRAST:  46mL GADAVIST GADOBUTROL 1 MMOL/ML IV SOLN COMPARISON:  None. FINDINGS: Lower chest: No acute findings. Hepatobiliary: No mass or other parenchymal abnormality identified. Pancreas: No mass, inflammatory changes, or other parenchymal abnormality identified. Spleen:  Within normal limits in size and appearance. Adrenals/Urinary Tract: No masses identified. No evidence of hydronephrosis. Stomach/Bowel: Visualized portions within the abdomen are unremarkable. Vascular/Lymphatic: No pathologically enlarged lymph nodes identified. No abdominal aortic aneurysm demonstrated. Other:  None. Musculoskeletal: No suspicious bone lesions identified. Marrow signal dropout in keeping with iron deposition. IMPRESSION: 1. Marrow signal dropout in keeping with iron deposition and reported diagnosis of hereditary hemochromatosis. There is no significant parenchymal iron deposition in the liver or pancreas appreciated on non quantitative imaging. 2. No other significant abnormality identified within the abdomen. No findings to explain weight loss. Electronically Signed   By: Eddie Candle M.D.   On: 06/04/2020 14:31    ASSESSMENT: Hereditary hemochromatosis.  PLAN:     1. Hereditary hemochromatosis: Initially diagnosed in February 2017, patient was noted to be homozygous.  Her most recent ferritin levels on May 20, 2020 were reported at 264.  Patient will likely require routine phlebotomy to maintain ferritin levels near 100.  Have recommended that she continue to donate blood as she did previously every 3 months.  Continue to monitor ferritin level once or twice per year.  If there are any other concerns or questions please refer patient back for further evaluation. No follow-up has been scheduled.  I spent a total of 45 minutes reviewing chart data, face-to-face evaluation with the patient, counseling and coordination of care as detailed above.   Patient expressed understanding and was in agreement with this plan. She also understands that She can call clinic at any time with any questions, concerns, or complaints.    Lloyd Huger, MD   06/28/2020 3:57 PM

## 2020-06-28 ENCOUNTER — Other Ambulatory Visit: Payer: Self-pay

## 2020-06-28 ENCOUNTER — Inpatient Hospital Stay: Payer: Medicare Other | Attending: Oncology | Admitting: Oncology

## 2020-06-28 ENCOUNTER — Encounter: Payer: Self-pay | Admitting: Oncology

## 2020-06-28 DIAGNOSIS — Z800 Family history of malignant neoplasm of digestive organs: Secondary | ICD-10-CM | POA: Insufficient documentation

## 2020-06-28 DIAGNOSIS — Z87891 Personal history of nicotine dependence: Secondary | ICD-10-CM | POA: Insufficient documentation

## 2020-06-28 DIAGNOSIS — Z803 Family history of malignant neoplasm of breast: Secondary | ICD-10-CM | POA: Insufficient documentation

## 2020-07-13 ENCOUNTER — Other Ambulatory Visit: Payer: Medicare Other

## 2020-07-19 ENCOUNTER — Inpatient Hospital Stay: Admission: RE | Admit: 2020-07-19 | Payer: Medicare Other | Source: Ambulatory Visit

## 2020-07-21 ENCOUNTER — Inpatient Hospital Stay: Admit: 2020-07-21 | Payer: Medicare Other | Admitting: Surgery

## 2020-07-21 SURGERY — ARTHROPLASTY, KNEE, TOTAL
Anesthesia: Choice | Site: Knee | Laterality: Left

## 2020-10-19 ENCOUNTER — Encounter: Payer: Self-pay | Admitting: Podiatry

## 2020-10-19 ENCOUNTER — Other Ambulatory Visit: Payer: Self-pay

## 2020-10-19 ENCOUNTER — Ambulatory Visit (INDEPENDENT_AMBULATORY_CARE_PROVIDER_SITE_OTHER): Payer: Medicare Other | Admitting: Podiatry

## 2020-10-19 DIAGNOSIS — L603 Nail dystrophy: Secondary | ICD-10-CM | POA: Diagnosis not present

## 2020-10-19 NOTE — Progress Notes (Signed)
She presents today concerned about fungus returning to her great toe hallux left.  She states become more thick over the past few months and she would like to consider having it treated once again.  She does not feel like she wants to take the oral medicine again she would like to try laser therapy.  Objective: Vital signs are stable she is alert and oriented x3 she has had nail dystrophy with onychomycosis associated with it per pathology.  Assessment: Onychomycosis.  Plan: At this point were going to set her up for laser therapy at the [facility] office.  Follow-up with me as needed.

## 2020-10-21 ENCOUNTER — Other Ambulatory Visit: Payer: Self-pay

## 2020-10-21 ENCOUNTER — Ambulatory Visit (INDEPENDENT_AMBULATORY_CARE_PROVIDER_SITE_OTHER): Payer: Medicare Other | Admitting: *Deleted

## 2020-10-21 DIAGNOSIS — L603 Nail dystrophy: Secondary | ICD-10-CM

## 2020-10-21 DIAGNOSIS — B351 Tinea unguium: Secondary | ICD-10-CM

## 2020-10-21 NOTE — Patient Instructions (Signed)

## 2020-10-21 NOTE — Progress Notes (Signed)
Patient presents today for the 1st laser treatment. Diagnosed with mycotic nail infection by Dr. Milinda Pointer.   Toenail most affected hallux left.  All other systems are negative.  Nails were filed thin. Laser therapy was administered to 1st toenails left and patient tolerated the treatment well. All safety precautions were in place.    Follow up in 4 weeks for laser # 2.  Picture of nails taken today to document visual progress

## 2020-11-18 ENCOUNTER — Other Ambulatory Visit: Payer: Medicare Other

## 2020-11-21 ENCOUNTER — Other Ambulatory Visit: Payer: Self-pay

## 2020-11-21 ENCOUNTER — Ambulatory Visit (INDEPENDENT_AMBULATORY_CARE_PROVIDER_SITE_OTHER): Payer: Medicare Other | Admitting: *Deleted

## 2020-11-21 DIAGNOSIS — B351 Tinea unguium: Secondary | ICD-10-CM

## 2020-11-21 DIAGNOSIS — L603 Nail dystrophy: Secondary | ICD-10-CM

## 2020-11-21 NOTE — Progress Notes (Signed)
Patient presents today for the 2nd laser treatment. Diagnosed with mycotic nail infection by Dr. Milinda Pointer.   Toenail most affected hallux left. The coloration is still unchanged, but she says the toe feels better.  All other systems are negative.  Nails were filed thin. Laser therapy was administered to 1st toenails left and patient tolerated the treatment well. All safety precautions were in place.    Follow up in 4 weeks for laser # 3.

## 2020-11-22 ENCOUNTER — Encounter: Payer: Self-pay | Admitting: Urgent Care

## 2020-11-22 ENCOUNTER — Other Ambulatory Visit: Payer: Self-pay | Admitting: Surgery

## 2020-11-22 ENCOUNTER — Encounter
Admission: RE | Admit: 2020-11-22 | Discharge: 2020-11-22 | Disposition: A | Discharge status: Home / Self Care | Payer: Medicare Other | Source: Ambulatory Visit | Attending: Surgery | Admitting: Surgery

## 2020-11-22 DIAGNOSIS — Z01818 Encounter for other preprocedural examination: Secondary | ICD-10-CM | POA: Diagnosis present

## 2020-11-22 LAB — TYPE AND SCREEN
ABO/RH(D): A POS
Antibody Screen: NEGATIVE

## 2020-11-22 LAB — CBC
HCT: 33.7 % — ABNORMAL LOW (ref 36.0–46.0)
Hemoglobin: 11.4 g/dL — ABNORMAL LOW (ref 12.0–15.0)
MCH: 36.4 pg — ABNORMAL HIGH (ref 26.0–34.0)
MCHC: 33.8 g/dL (ref 30.0–36.0)
MCV: 107.7 fL — ABNORMAL HIGH (ref 80.0–100.0)
Platelets: 363 10*3/uL (ref 150–400)
RBC: 3.13 MIL/uL — ABNORMAL LOW (ref 3.87–5.11)
RDW: 11.8 % (ref 11.5–15.5)
WBC: 5.9 10*3/uL (ref 4.0–10.5)
nRBC: 0 % (ref 0.0–0.2)

## 2020-11-22 LAB — SURGICAL PCR SCREEN
MRSA, PCR: NEGATIVE
Staphylococcus aureus: NEGATIVE

## 2020-11-22 LAB — BASIC METABOLIC PANEL
Anion gap: 8 (ref 5–15)
BUN: 18 mg/dL (ref 8–23)
CO2: 28 mmol/L (ref 22–32)
Calcium: 9.4 mg/dL (ref 8.9–10.3)
Chloride: 104 mmol/L (ref 98–111)
Creatinine, Ser: 0.78 mg/dL (ref 0.44–1.00)
GFR, Estimated: >60 mL/min (ref >60)
Glucose, Bld: 117 mg/dL — ABNORMAL HIGH (ref 70–99)
Potassium: 3.6 mmol/L (ref 3.5–5.1)
Sodium: 140 mmol/L (ref 135–145)

## 2020-11-22 LAB — URINALYSIS, ROUTINE W REFLEX MICROSCOPIC
Bilirubin Urine: NEGATIVE
Glucose, UA: NEGATIVE mg/dL
Hgb urine dipstick: NEGATIVE
Ketones, ur: NEGATIVE mg/dL
Leukocytes,Ua: NEGATIVE
Nitrite: NEGATIVE
Protein, ur: NEGATIVE mg/dL
Specific Gravity, Urine: 1.003 — ABNORMAL LOW (ref 1.005–1.030)
pH: 7 (ref 5.0–8.0)

## 2020-11-22 NOTE — Patient Instructions (Signed)
Your procedure is scheduled on: Thursday 12/01/20.  Report to THE FIRST FLOOR REGISTRATION DESK IN THE MEDICAL MALL ON THE MORNING OF SURGERY FIRST, THEN YOU WILL CHECK IN AT THE SURGERY INFORMATION DESK LOCATED OUTSIDE THE SAME DAY SURGERY DEPARTMENT LOCATED ON 2ND FLOOR MEDICAL MALL ENTRANCE.  To find out your arrival time please call [phone] between 1PM - 3PM on Wednesday 11/29/20.   Remember: Instructions that are not followed completely may result in serious medical risk, up to and including death, or upon the discretion of your surgeon and anesthesiologist your surgery may need to be rescheduled.     __X__ 1. Do not eat food after midnight the night before your procedure.                 No gum chewing or hard candies. You may drink clear liquids up to 2 hours                 before you are scheduled to arrive for your surgery- DO NOT drink clear                 liquids within 2 hours of the start of your surgery.                 Clear Liquids include:  water, apple juice without pulp, clear carbohydrate                 drink such as Clearfast or Gatorade, Black Coffee or Tea (Do not add                 milk or creamer to coffee or tea).   __X__2.  On the morning of surgery brush your teeth with toothpaste and water, you may rinse your mouth with mouthwash if you wish.  Do not swallow any toothpaste or mouthwash.    __X__ 3.  No Alcohol for 24 hours before or after surgery.  __X__ 4.  Do Not Smoke or use e-cigarettes For 24 Hours Prior to Your Surgery.                 Do not use any chewable tobacco products for at least 6 hours prior to                 surgery.  __X__5.  Notify your doctor if there is any change in your medical condition      (cold, fever, infections).      Do NOT wear jewelry, make-up, hairpins, clips or nail polish. Do NOT wear lotions, powders, or perfumes.  Do NOT shave 48 hours prior to surgery. Men may shave face and neck. Do NOT bring valuables to  the hospital.     [facility] is not responsible for any belongings or valuables.   Contacts, dentures/partials or body piercings may not be worn into surgery. Bring a case for your contacts, glasses or hearing aids, a denture cup will be supplied.  Leave your suitcase in the car. After surgery it may be brought to your room.   For patients admitted to the hospital, discharge time is determined by your treatment team.    Patients discharged the day of surgery will not be allowed to drive home.     __X__ Take these medicines the morning of surgery with A SIP OF WATER:     1. Carboxymethylcellulose Sodium (REFRESH LIQUIGEL OP)     __X__ Use CHG Soap as directed.  __X__ Stop Blood Thinners: Aspirin  7 days prior to your procedure.   __X__ Stop Anti-inflammatories 7 days before surgery such as Advil, Ibuprofen, Motrin, BC or Goodies Powder, Naprosyn, Naproxen, Aleve, Aspirin, Meloxicam. May take Tylenol if needed for pain or discomfort.   __X__Do not start taking any new herbal supplements or vitamins prior to your procedure.  __X__ Stop the following herbal supplements or vitamins:  ASHWAGANDHA PO  Omega-3 Fatty Acids (FISH OIL)      Wear comfortable clothing (specific to your surgery type) to the hospital.  Plan for stool softeners for home use; pain medications have a tendency to cause constipation. You can also help prevent constipation by eating foods high in fiber such as fruits and vegetables and drinking plenty of fluids as your diet allows.  After surgery, you can prevent lung complications by doing breathing exercises.Take deep breaths and cough every 1-2 hours. Your doctor may order a device called an Incentive Spirometer to help you take deep breaths.  Please call the [place] Department at [phone] if you have any questions about these instructions.

## 2020-11-23 ENCOUNTER — Other Ambulatory Visit: Payer: Medicare Other

## 2020-11-29 ENCOUNTER — Other Ambulatory Visit: Admission: RE | Admit: 2020-11-29 | Payer: Medicare Other | Source: Ambulatory Visit

## 2020-12-01 ENCOUNTER — Encounter: Admission: RE | Payer: Self-pay | Source: Home / Self Care

## 2020-12-01 ENCOUNTER — Inpatient Hospital Stay: Admission: RE | Admit: 2020-12-01 | Payer: Medicare Other | Source: Home / Self Care | Admitting: Surgery

## 2020-12-01 SURGERY — ARTHROPLASTY, KNEE, TOTAL
Anesthesia: Choice | Site: Knee | Laterality: Left

## 2020-12-19 ENCOUNTER — Other Ambulatory Visit: Payer: Self-pay

## 2020-12-19 ENCOUNTER — Ambulatory Visit (INDEPENDENT_AMBULATORY_CARE_PROVIDER_SITE_OTHER): Payer: Medicare Other | Admitting: *Deleted

## 2020-12-19 DIAGNOSIS — L603 Nail dystrophy: Secondary | ICD-10-CM

## 2020-12-19 DIAGNOSIS — B351 Tinea unguium: Secondary | ICD-10-CM

## 2020-12-19 NOTE — Progress Notes (Signed)
Patient presents today for the 3rd laser treatment. Diagnosed with mycotic nail infection by Dr. Milinda Pointer.   Toenail most affected hallux left. The coloration is still unchanged, but she says the toe feels better.  All other systems are negative.  Nails were filed thin. Laser therapy was administered to 1st toenails left and patient tolerated the treatment well. All safety precautions were in place.    Follow up in 6 weeks for laser # 4.

## 2021-01-10 ENCOUNTER — Other Ambulatory Visit: Payer: Self-pay | Admitting: Surgery

## 2021-01-13 ENCOUNTER — Encounter
Admission: RE | Admit: 2021-01-13 | Discharge: 2021-01-13 | Disposition: A | Discharge status: Home / Self Care | Payer: Medicare Other | Source: Ambulatory Visit | Attending: Surgery | Admitting: Surgery

## 2021-01-13 ENCOUNTER — Other Ambulatory Visit: Payer: Self-pay

## 2021-01-13 DIAGNOSIS — Z01812 Encounter for preprocedural laboratory examination: Secondary | ICD-10-CM | POA: Insufficient documentation

## 2021-01-13 HISTORY — DX: Hemochromatosis, unspecified: E83.119

## 2021-01-13 LAB — CBC WITH DIFFERENTIAL/PLATELET
Abs Immature Granulocytes: 0.01 10*3/uL (ref 0.00–0.07)
Basophils Absolute: 0.1 10*3/uL (ref 0.0–0.1)
Basophils Relative: 1 %
Eosinophils Absolute: 0.1 10*3/uL (ref 0.0–0.5)
Eosinophils Relative: 2 %
HCT: 34.7 % — ABNORMAL LOW (ref 36.0–46.0)
Hemoglobin: 11.7 g/dL — ABNORMAL LOW (ref 12.0–15.0)
Immature Granulocytes: 0 %
Lymphocytes Relative: 39 %
Lymphs Abs: 2.6 10*3/uL (ref 0.7–4.0)
MCH: 35.9 pg — ABNORMAL HIGH (ref 26.0–34.0)
MCHC: 33.7 g/dL (ref 30.0–36.0)
MCV: 106.4 fL — ABNORMAL HIGH (ref 80.0–100.0)
Monocytes Absolute: 0.5 10*3/uL (ref 0.1–1.0)
Monocytes Relative: 8 %
Neutro Abs: 3.3 10*3/uL (ref 1.7–7.7)
Neutrophils Relative %: 50 %
Platelets: 369 10*3/uL (ref 150–400)
RBC: 3.26 MIL/uL — ABNORMAL LOW (ref 3.87–5.11)
RDW: 11.9 % (ref 11.5–15.5)
WBC: 6.6 10*3/uL (ref 4.0–10.5)
nRBC: 0 % (ref 0.0–0.2)

## 2021-01-13 LAB — TYPE AND SCREEN
ABO/RH(D): A POS
Antibody Screen: NEGATIVE

## 2021-01-13 LAB — SURGICAL PCR SCREEN
MRSA, PCR: NEGATIVE
Staphylococcus aureus: NEGATIVE

## 2021-01-13 NOTE — Patient Instructions (Signed)
Your procedure is scheduled on: Tuesday, March 15 Report to the Registration Desk on the 1st floor of the [facility]. To find out your arrival time, please call [phone] between 1PM - 3PM on: Monday, March 14  REMEMBER: Instructions that are not followed completely may result in serious medical risk, up to and including death; or upon the discretion of your surgeon and anesthesiologist your surgery may need to be rescheduled.  Do not eat food after midnight the night before surgery.  No gum chewing, lozengers or hard candies.  You may however, drink CLEAR liquids up to 2 hours before you are scheduled to arrive for your surgery. Do not drink anything within 2 hours of your scheduled arrival time.  Clear liquids include: - water  - apple juice without pulp - gatorade (not RED, PURPLE, OR BLUE) - black coffee or tea (Do NOT add milk or creamers to the coffee or tea) Do NOT drink anything that is not on this list.  In addition, your doctor has ordered for you to drink the provided  Ensure Pre-Surgery Clear Carbohydrate Drink  Drinking this carbohydrate drink up to two hours before surgery helps to reduce insulin resistance and improve patient outcomes. Please complete drinking 2 hours prior to scheduled arrival time.  DO NOT TAKE ANY MEDICATIONS THE MORNING OF SURGERY  One week prior to surgery: STARTING MARCH 8 Stop ASPIRIN, Anti-inflammatories (NSAIDS) such as Advil, Aleve, Ibuprofen, Motrin, Naproxen, Naprosyn and Aspirin based products such as Excedrin, Goodys Powder, BC Powder. Stop ANY OVER THE COUNTER supplements until after surgery. MAY CONTINUE TAKING VITAMIN D, VITAMIN B AND MIRALAX  No Alcohol for 24 hours before or after surgery.  On the morning of surgery brush your teeth with toothpaste and water, you may rinse your mouth with mouthwash if you wish. Do not swallow any toothpaste or mouthwash.  Do not wear jewelry, make-up, hairpins, clips or nail polish.  Do  not wear lotions, powders, or perfumes.   Do not shave body from the neck down 48 hours prior to surgery just in case you cut yourself which could leave a site for infection.  Also, freshly shaved skin may become irritated if using the CHG soap.  Do not bring valuables to the hospital. [facility] is not responsible for any missing/lost belongings or valuables.   Use CHG Soap as directed on instruction sheet.  Notify your doctor if there is any change in your medical condition (cold, fever, infection).  Wear comfortable clothing (specific to your surgery type) to the hospital.  Plan for stool softeners for home use; pain medications have a tendency to cause constipation. You can also help prevent constipation by eating foods high in fiber such as fruits and vegetables and drinking plenty of fluids as your diet allows.  After surgery, you can help prevent lung complications by doing breathing exercises.  Take deep breaths and cough every 1-2 hours. Your doctor may order a device called an Incentive Spirometer to help you take deep breaths.  If you are being admitted to the hospital overnight, leave your suitcase in the car. After surgery it may be brought to your room.  If you are being discharged the day of surgery, you will not be allowed to drive home. You will need a responsible adult (18 years or older) to drive you home and stay with you that night.   If you are taking public transportation, you will need to have a responsible adult (18 years or older)  with you. Please confirm with your physician that it is acceptable to use public transportation.   Please call the [place] Dept. at [phone] if you have any questions about these instructions.  Surgery Visitation Policy:  Patients undergoing a surgery or procedure may have one family member or support person with them as long as that person is not COVID-19 positive or experiencing its symptoms.  That person  may remain in the waiting area during the procedure.  Inpatient Visitation:    Visiting hours are 7 a.m. to 8 p.m. Inpatients will be allowed two visitors daily. The visitors may change each day during the patient's stay. No visitors under the age of 24. Any visitor under the age of 62 must be accompanied by an adult. The visitor must pass COVID-19 screenings, use hand sanitizer when entering and exiting the patient's room and wear a mask at all times, including in the patient's room. Patients must also wear a mask when staff or their visitor are in the room. Masking is required regardless of vaccination status.

## 2021-01-20 ENCOUNTER — Other Ambulatory Visit
Admission: RE | Admit: 2021-01-20 | Discharge: 2021-01-20 | Disposition: A | Discharge status: Home / Self Care | Payer: Medicare Other | Source: Ambulatory Visit | Attending: Surgery | Admitting: Surgery

## 2021-01-20 ENCOUNTER — Other Ambulatory Visit: Payer: Self-pay

## 2021-01-20 DIAGNOSIS — Z01812 Encounter for preprocedural laboratory examination: Secondary | ICD-10-CM | POA: Diagnosis present

## 2021-01-20 DIAGNOSIS — U071 COVID-19: Secondary | ICD-10-CM | POA: Insufficient documentation

## 2021-01-21 LAB — SARS CORONAVIRUS 2 (TAT 6-24 HRS): SARS Coronavirus 2: POSITIVE — AB

## 2021-01-23 ENCOUNTER — Telehealth: Payer: Self-pay

## 2021-01-23 NOTE — Telephone Encounter (Signed)
Called to discuss with patient about COVID-19 symptoms and the use of one of the available treatments for those with mild to moderate Covid symptoms and at a high risk of hospitalization.  Pt appears to qualify for outpatient treatment due to co-morbid conditions and/or a member of an at-risk group in accordance with the FDA Emergency Use Authorization.    Symptom onset: None Vaccinated: Yes Booster? No Immunocompromised? No Qualifiers: None  Pt. Reports asymptomatic.  Dorothy Turner

## 2021-01-30 ENCOUNTER — Ambulatory Visit (INDEPENDENT_AMBULATORY_CARE_PROVIDER_SITE_OTHER): Payer: Medicare Other

## 2021-01-30 ENCOUNTER — Other Ambulatory Visit: Payer: Self-pay

## 2021-01-30 DIAGNOSIS — L603 Nail dystrophy: Secondary | ICD-10-CM

## 2021-01-30 DIAGNOSIS — B351 Tinea unguium: Secondary | ICD-10-CM

## 2021-01-30 NOTE — Progress Notes (Signed)
Patient presents today for the 4th laser treatment. Diagnosed with mycotic nail infection by Dr. Milinda Pointer.   Toenail most affected hallux left. The coloration is still unchanged, but she says the toe feels better.  All other systems are negative.  Nails were filed thin. Laser therapy was administered to 1st toenails left and patient tolerated the treatment well. All safety precautions were in place.    Follow up in 6 weeks for laser # 5.

## 2021-02-21 ENCOUNTER — Other Ambulatory Visit: Payer: Self-pay

## 2021-02-21 ENCOUNTER — Inpatient Hospital Stay: Payer: Medicare Other | Admitting: Certified Registered"

## 2021-02-21 ENCOUNTER — Inpatient Hospital Stay
Admission: RE | Admit: 2021-02-21 | Discharge: 2021-02-22 | DRG: 470 | Disposition: A | Discharge status: Home Health | Payer: Medicare Other | Attending: Surgery | Admitting: Surgery

## 2021-02-21 ENCOUNTER — Inpatient Hospital Stay: Payer: Medicare Other

## 2021-02-21 ENCOUNTER — Encounter: Payer: Self-pay | Admitting: Surgery

## 2021-02-21 ENCOUNTER — Encounter
Admission: RE | Disposition: A | Discharge status: Home Health | Payer: Self-pay | Source: Home / Self Care | Attending: Surgery

## 2021-02-21 DIAGNOSIS — Z8249 Family history of ischemic heart disease and other diseases of the circulatory system: Secondary | ICD-10-CM

## 2021-02-21 DIAGNOSIS — Z85828 Personal history of other malignant neoplasm of skin: Secondary | ICD-10-CM

## 2021-02-21 DIAGNOSIS — Z923 Personal history of irradiation: Secondary | ICD-10-CM | POA: Diagnosis not present

## 2021-02-21 DIAGNOSIS — D519 Vitamin B12 deficiency anemia, unspecified: Secondary | ICD-10-CM | POA: Diagnosis present

## 2021-02-21 DIAGNOSIS — F419 Anxiety disorder, unspecified: Secondary | ICD-10-CM | POA: Diagnosis present

## 2021-02-21 DIAGNOSIS — K219 Gastro-esophageal reflux disease without esophagitis: Secondary | ICD-10-CM | POA: Diagnosis not present

## 2021-02-21 DIAGNOSIS — M1712 Unilateral primary osteoarthritis, left knee: Secondary | ICD-10-CM | POA: Diagnosis present

## 2021-02-21 DIAGNOSIS — Z800 Family history of malignant neoplasm of digestive organs: Secondary | ICD-10-CM | POA: Diagnosis not present

## 2021-02-21 DIAGNOSIS — Z886 Allergy status to analgesic agent status: Secondary | ICD-10-CM

## 2021-02-21 DIAGNOSIS — Z791 Long term (current) use of non-steroidal anti-inflammatories (NSAID): Secondary | ICD-10-CM

## 2021-02-21 DIAGNOSIS — Z9011 Acquired absence of right breast and nipple: Secondary | ICD-10-CM

## 2021-02-21 DIAGNOSIS — Z836 Family history of other diseases of the respiratory system: Secondary | ICD-10-CM | POA: Diagnosis not present

## 2021-02-21 DIAGNOSIS — Z79899 Other long term (current) drug therapy: Secondary | ICD-10-CM | POA: Diagnosis not present

## 2021-02-21 DIAGNOSIS — E785 Hyperlipidemia, unspecified: Secondary | ICD-10-CM | POA: Diagnosis not present

## 2021-02-21 DIAGNOSIS — Z96652 Presence of left artificial knee joint: Secondary | ICD-10-CM

## 2021-02-21 DIAGNOSIS — R112 Nausea with vomiting, unspecified: Secondary | ICD-10-CM | POA: Diagnosis not present

## 2021-02-21 DIAGNOSIS — Z881 Allergy status to other antibiotic agents status: Secondary | ICD-10-CM | POA: Diagnosis not present

## 2021-02-21 DIAGNOSIS — Z853 Personal history of malignant neoplasm of breast: Secondary | ICD-10-CM

## 2021-02-21 HISTORY — PX: TOTAL KNEE ARTHROPLASTY: SHX125

## 2021-02-21 LAB — TYPE AND SCREEN
ABO/RH(D): A POS
Antibody Screen: NEGATIVE

## 2021-02-21 SURGERY — ARTHROPLASTY, KNEE, TOTAL
Anesthesia: General | Site: Knee | Laterality: Left

## 2021-02-21 MED ORDER — ORAL CARE MOUTH RINSE: 15.0000 mL | Freq: Once | OROMUCOSAL | Status: AC

## 2021-02-21 MED ORDER — FLEET ENEMA 7-19 GM/118ML RE ENEM: 1.0000 | ENEMA | Freq: Once | RECTAL | Status: DC | PRN

## 2021-02-21 MED ORDER — CHLORHEXIDINE GLUCONATE 0.12 % MT SOLN
OROMUCOSAL | Status: AC
  Administered 2021-02-21: 15 mL via OROMUCOSAL
  Filled 2021-02-21: qty 15

## 2021-02-21 MED ORDER — ROCURONIUM BROMIDE 100 MG/10ML IV SOLN
INTRAVENOUS | Status: DC | PRN
  Administered 2021-02-21: 20 mg via INTRAVENOUS
  Administered 2021-02-21: 30 mg via INTRAVENOUS
  Administered 2021-02-21: 20 mg via INTRAVENOUS

## 2021-02-21 MED ORDER — ACETAMINOPHEN 10 MG/ML IV SOLN
INTRAVENOUS | Status: DC | PRN
  Administered 2021-02-21: 1000 mg via INTRAVENOUS

## 2021-02-21 MED ORDER — LIDOCAINE HCL (PF) 2 % IJ SOLN
INTRAMUSCULAR | Status: AC
  Filled 2021-02-21: qty 5

## 2021-02-21 MED ORDER — SUCCINYLCHOLINE CHLORIDE 20 MG/ML IJ SOLN
INTRAMUSCULAR | Status: DC | PRN
  Administered 2021-02-21: 120 mg via INTRAVENOUS

## 2021-02-21 MED ORDER — FAMOTIDINE 20 MG PO TABS
ORAL_TABLET | ORAL | Status: AC
  Administered 2021-02-21: 20 mg via ORAL
  Filled 2021-02-21: qty 1

## 2021-02-21 MED ORDER — KETOROLAC TROMETHAMINE 15 MG/ML IJ SOLN: 15.0000 mg | Freq: Once | INTRAMUSCULAR | Status: AC

## 2021-02-21 MED ORDER — FENTANYL CITRATE (PF) 100 MCG/2ML IJ SOLN
INTRAMUSCULAR | Status: AC
  Administered 2021-02-21: 50 ug via INTRAVENOUS
  Filled 2021-02-21: qty 2

## 2021-02-21 MED ORDER — TRANEXAMIC ACID 1000 MG/10ML IV SOLN
INTRAVENOUS | Status: DC | PRN
  Administered 2021-02-21: 1000 mg via TOPICAL

## 2021-02-21 MED ORDER — BISACODYL 10 MG RE SUPP: 10.0000 mg | Freq: Every day | RECTAL | Status: DC | PRN

## 2021-02-21 MED ORDER — VALACYCLOVIR HCL 500 MG PO TABS
500.0000 mg | ORAL_TABLET | Freq: Every day | ORAL | Status: DC
  Administered 2021-02-21 – 2021-02-22 (×2): 500 mg via ORAL
  Filled 2021-02-21 (×2): qty 1

## 2021-02-21 MED ORDER — VITAMIN D 25 MCG (1000 UNIT) PO TABS
2000.0000 [IU] | ORAL_TABLET | Freq: Every day | ORAL | Status: DC
  Administered 2021-02-22: 2000 [IU] via ORAL
  Filled 2021-02-21 (×3): qty 2

## 2021-02-21 MED ORDER — ONDANSETRON HCL 4 MG PO TABS: 4.0000 mg | ORAL_TABLET | Freq: Four times a day (QID) | ORAL | Status: DC | PRN

## 2021-02-21 MED ORDER — MIDAZOLAM HCL 2 MG/2ML IJ SOLN
INTRAMUSCULAR | Status: AC
  Filled 2021-02-21: qty 2

## 2021-02-21 MED ORDER — OMEGA-3-ACID ETHYL ESTERS 1 G PO CAPS
1000.0000 mg | ORAL_CAPSULE | Freq: Every day | ORAL | Status: DC
  Administered 2021-02-22: 1000 mg via ORAL
  Filled 2021-02-21: qty 1

## 2021-02-21 MED ORDER — CHLORHEXIDINE GLUCONATE 0.12 % MT SOLN: 15.0000 mL | Freq: Once | OROMUCOSAL | Status: AC

## 2021-02-21 MED ORDER — BUPIVACAINE-EPINEPHRINE (PF) 0.5% -1:200000 IJ SOLN
INTRAMUSCULAR | Status: DC | PRN
  Administered 2021-02-21: 30 mL

## 2021-02-21 MED ORDER — KETOROLAC TROMETHAMINE 15 MG/ML IJ SOLN: 7.5000 mg | Freq: Four times a day (QID) | INTRAMUSCULAR | Status: DC

## 2021-02-21 MED ORDER — ASPIRIN 325 MG PO TABS
650.0000 mg | ORAL_TABLET | Freq: Four times a day (QID) | ORAL | Status: DC | PRN
  Filled 2021-02-21: qty 2

## 2021-02-21 MED ORDER — POLYETHYLENE GLYCOL 3350 17 G PO PACK
17.0000 g | PACK | ORAL | Status: DC
  Filled 2021-02-21: qty 1

## 2021-02-21 MED ORDER — PROPOFOL 500 MG/50ML IV EMUL
INTRAVENOUS | Status: DC | PRN
  Administered 2021-02-21: 100 ug/kg/min via INTRAVENOUS

## 2021-02-21 MED ORDER — SODIUM CHLORIDE 0.9 % IV SOLN
INTRAVENOUS | Status: DC | PRN
  Administered 2021-02-21: 25 ug/min via INTRAVENOUS

## 2021-02-21 MED ORDER — ONDANSETRON HCL 4 MG/2ML IJ SOLN
INTRAMUSCULAR | Status: AC
  Filled 2021-02-21: qty 2

## 2021-02-21 MED ORDER — HYDROMORPHONE HCL 1 MG/ML IJ SOLN
0.2500 mg | INTRAMUSCULAR | Status: DC | PRN
Start: 2021-02-21 — End: 2021-02-21
  Administered 2021-02-21: 0.5 mg via INTRAVENOUS

## 2021-02-21 MED ORDER — PROPOFOL 10 MG/ML IV BOLUS
INTRAVENOUS | Status: DC | PRN
  Administered 2021-02-21: 30 mg via INTRAVENOUS
  Administered 2021-02-21: 70 mg via INTRAVENOUS
  Administered 2021-02-21: 40 mg via INTRAVENOUS
  Administered 2021-02-21: 30 mg via INTRAVENOUS

## 2021-02-21 MED ORDER — METOCLOPRAMIDE HCL 5 MG/ML IJ SOLN
INTRAMUSCULAR | Status: AC
  Administered 2021-02-21: 10 mg via INTRAVENOUS
  Filled 2021-02-21: qty 2

## 2021-02-21 MED ORDER — MAGNESIUM HYDROXIDE 400 MG/5ML PO SUSP: 30.0000 mL | Freq: Every day | ORAL | Status: DC | PRN

## 2021-02-21 MED ORDER — METOCLOPRAMIDE HCL 5 MG/ML IJ SOLN: 5.0000 mg | Freq: Three times a day (TID) | INTRAMUSCULAR | Status: DC | PRN

## 2021-02-21 MED ORDER — APIXABAN 2.5 MG PO TABS
2.5000 mg | ORAL_TABLET | Freq: Two times a day (BID) | ORAL | Status: DC
  Administered 2021-02-22: 2.5 mg via ORAL
  Filled 2021-02-21: qty 1

## 2021-02-21 MED ORDER — HYDROCODONE-ACETAMINOPHEN 5-325 MG PO TABS
1.0000 | ORAL_TABLET | ORAL | Status: DC | PRN
  Administered 2021-02-21 – 2021-02-22 (×4): 2 via ORAL
  Filled 2021-02-21 (×4): qty 2

## 2021-02-21 MED ORDER — CEFAZOLIN SODIUM-DEXTROSE 2-4 GM/100ML-% IV SOLN
INTRAVENOUS | Status: AC
  Administered 2021-02-21: 2 g via INTRAVENOUS
  Filled 2021-02-21: qty 100

## 2021-02-21 MED ORDER — ACETAMINOPHEN 500 MG PO TABS: 500.0000 mg | ORAL_TABLET | Freq: Four times a day (QID) | ORAL | Status: DC

## 2021-02-21 MED ORDER — ONDANSETRON HCL 4 MG/2ML IJ SOLN: 4.0000 mg | Freq: Four times a day (QID) | INTRAMUSCULAR | Status: DC | PRN

## 2021-02-21 MED ORDER — ACETAMINOPHEN 500 MG PO TABS
500.0000 mg | ORAL_TABLET | Freq: Four times a day (QID) | ORAL | Status: DC
  Administered 2021-02-21 – 2021-02-22 (×3): 500 mg via ORAL
  Filled 2021-02-21 (×3): qty 1

## 2021-02-21 MED ORDER — PROMETHAZINE HCL 25 MG/ML IJ SOLN: 12.5000 mg | Freq: Four times a day (QID) | INTRAMUSCULAR | Status: DC | PRN

## 2021-02-21 MED ORDER — TURMERIC 500 MG PO CAPS: 1.0000 | ORAL_CAPSULE | Freq: Every day | ORAL | Status: DC

## 2021-02-21 MED ORDER — FENTANYL CITRATE (PF) 100 MCG/2ML IJ SOLN
INTRAMUSCULAR | Status: AC
  Administered 2021-02-21: 25 ug via INTRAVENOUS
  Filled 2021-02-21: qty 2

## 2021-02-21 MED ORDER — FAMOTIDINE 20 MG PO TABS: 20.0000 mg | ORAL_TABLET | Freq: Once | ORAL | Status: AC

## 2021-02-21 MED ORDER — BUPIVACAINE HCL (PF) 0.5 % IJ SOLN
INTRAMUSCULAR | Status: AC
  Filled 2021-02-21: qty 10

## 2021-02-21 MED ORDER — PROMETHAZINE HCL 25 MG RE SUPP
12.5000 mg | Freq: Four times a day (QID) | RECTAL | Status: DC | PRN
  Filled 2021-02-21: qty 1

## 2021-02-21 MED ORDER — OXYCODONE HCL 5 MG PO TABS
ORAL_TABLET | ORAL | Status: AC
  Filled 2021-02-21: qty 1

## 2021-02-21 MED ORDER — TRAMADOL HCL 50 MG PO TABS: 50.0000 mg | ORAL_TABLET | Freq: Four times a day (QID) | ORAL | Status: DC | PRN

## 2021-02-21 MED ORDER — CEFAZOLIN SODIUM-DEXTROSE 2-4 GM/100ML-% IV SOLN
INTRAVENOUS | Status: AC
  Administered 2021-02-22: 2 g via INTRAVENOUS
  Filled 2021-02-21: qty 100

## 2021-02-21 MED ORDER — BUPIVACAINE LIPOSOME 1.3 % IJ SUSP
INTRAMUSCULAR | Status: AC
  Filled 2021-02-21: qty 20

## 2021-02-21 MED ORDER — ACETAMINOPHEN 10 MG/ML IV SOLN
INTRAVENOUS | Status: AC
  Filled 2021-02-21: qty 100

## 2021-02-21 MED ORDER — TRANEXAMIC ACID 1000 MG/10ML IV SOLN
INTRAVENOUS | Status: AC
  Filled 2021-02-21: qty 10

## 2021-02-21 MED ORDER — MAGNESIUM GLUCONATE 500 MG PO TABS
500.0000 mg | ORAL_TABLET | Freq: Every day | ORAL | Status: DC
  Administered 2021-02-22: 500 mg via ORAL
  Filled 2021-02-21 (×2): qty 1

## 2021-02-21 MED ORDER — LIDOCAINE HCL (CARDIAC) PF 100 MG/5ML IV SOSY
PREFILLED_SYRINGE | INTRAVENOUS | Status: DC | PRN
  Administered 2021-02-21: 60 mg via INTRAVENOUS

## 2021-02-21 MED ORDER — HYDROCODONE-ACETAMINOPHEN 5-325 MG PO TABS
1.0000 | ORAL_TABLET | ORAL | Status: DC | PRN
  Administered 2021-02-21: 1 via ORAL

## 2021-02-21 MED ORDER — CEFAZOLIN SODIUM-DEXTROSE 2-4 GM/100ML-% IV SOLN
2.0000 g | Freq: Four times a day (QID) | INTRAVENOUS | Status: AC
  Administered 2021-02-21: 2 g via INTRAVENOUS
  Filled 2021-02-21 (×2): qty 100

## 2021-02-21 MED ORDER — VITAMIN B-12 1000 MCG PO TABS
1000.0000 ug | ORAL_TABLET | Freq: Every day | ORAL | Status: DC
  Administered 2021-02-22: 1000 ug via ORAL
  Filled 2021-02-21: qty 1

## 2021-02-21 MED ORDER — ONDANSETRON HCL 4 MG/2ML IJ SOLN
4.0000 mg | Freq: Four times a day (QID) | INTRAMUSCULAR | Status: DC | PRN
  Administered 2021-02-21 – 2021-02-22 (×2): 4 mg via INTRAVENOUS
  Filled 2021-02-21 (×3): qty 2

## 2021-02-21 MED ORDER — BUPIVACAINE HCL (PF) 0.5 % IJ SOLN
INTRAMUSCULAR | Status: DC | PRN
  Administered 2021-02-21: 2.5 mL

## 2021-02-21 MED ORDER — PROPOFOL 10 MG/ML IV BOLUS
INTRAVENOUS | Status: AC
  Filled 2021-02-21: qty 40

## 2021-02-21 MED ORDER — KETOROLAC TROMETHAMINE 15 MG/ML IJ SOLN
INTRAMUSCULAR | Status: AC
  Administered 2021-02-21: 15 mg via INTRAVENOUS
  Filled 2021-02-21: qty 1

## 2021-02-21 MED ORDER — ZOLPIDEM TARTRATE 5 MG PO TABS: 5.0000 mg | ORAL_TABLET | ORAL | Status: DC

## 2021-02-21 MED ORDER — SODIUM CHLORIDE 0.9 % BOLUS PEDS: 250.0000 mL | Freq: Once | INTRAVENOUS | Status: DC

## 2021-02-21 MED ORDER — HYDROCODONE-ACETAMINOPHEN 5-325 MG PO TABS
ORAL_TABLET | ORAL | Status: AC
  Filled 2021-02-21: qty 1

## 2021-02-21 MED ORDER — HYDROCODONE-ACETAMINOPHEN 5-325 MG PO TABS: 1.0000 | ORAL_TABLET | Freq: Four times a day (QID) | ORAL | 0 refills | Status: DC | PRN

## 2021-02-21 MED ORDER — ACETAMINOPHEN 325 MG PO TABS: 325.0000 mg | ORAL_TABLET | Freq: Four times a day (QID) | ORAL | Status: DC | PRN

## 2021-02-21 MED ORDER — SODIUM CHLORIDE FLUSH 0.9 % IV SOLN
INTRAVENOUS | Status: AC
  Filled 2021-02-21: qty 40

## 2021-02-21 MED ORDER — FENTANYL CITRATE (PF) 100 MCG/2ML IJ SOLN
INTRAMUSCULAR | Status: AC
  Filled 2021-02-21: qty 2

## 2021-02-21 MED ORDER — CEFAZOLIN SODIUM-DEXTROSE 2-4 GM/100ML-% IV SOLN
2.0000 g | INTRAVENOUS | Status: AC
  Administered 2021-02-21: 2 g via INTRAVENOUS

## 2021-02-21 MED ORDER — LACTATED RINGERS IV SOLN: INTRAVENOUS | Status: DC

## 2021-02-21 MED ORDER — CEFAZOLIN SODIUM-DEXTROSE 2-4 GM/100ML-% IV SOLN: 2.0000 g | Freq: Four times a day (QID) | INTRAVENOUS | Status: DC

## 2021-02-21 MED ORDER — HYDROMORPHONE HCL 1 MG/ML IJ SOLN
INTRAMUSCULAR | Status: AC
  Administered 2021-02-21: 0.5 mg via INTRAVENOUS
  Filled 2021-02-21: qty 1

## 2021-02-21 MED ORDER — OXYCODONE HCL 5 MG/5ML PO SOLN: 5.0000 mg | Freq: Once | ORAL | Status: AC | PRN

## 2021-02-21 MED ORDER — BUPIVACAINE-EPINEPHRINE (PF) 0.5% -1:200000 IJ SOLN
INTRAMUSCULAR | Status: AC
  Filled 2021-02-21: qty 30

## 2021-02-21 MED ORDER — METOCLOPRAMIDE HCL 10 MG PO TABS: 5.0000 mg | ORAL_TABLET | Freq: Three times a day (TID) | ORAL | Status: DC | PRN

## 2021-02-21 MED ORDER — FENTANYL CITRATE (PF) 100 MCG/2ML IJ SOLN
INTRAMUSCULAR | Status: DC | PRN
  Administered 2021-02-21: 25 ug via INTRAVENOUS
  Administered 2021-02-21: 50 ug via INTRAVENOUS
  Administered 2021-02-21: 25 ug via INTRAVENOUS
  Administered 2021-02-21 (×2): 50 ug via INTRAVENOUS

## 2021-02-21 MED ORDER — ARTIFICIAL TEARS OPHTHALMIC OINT
TOPICAL_OINTMENT | Freq: Every day | OPHTHALMIC | Status: DC | PRN
  Filled 2021-02-21: qty 3.5

## 2021-02-21 MED ORDER — MORPHINE SULFATE (PF) 2 MG/ML IV SOLN
0.5000 mg | INTRAVENOUS | Status: DC | PRN
  Administered 2021-02-21 – 2021-02-22 (×2): 1 mg via INTRAVENOUS
  Filled 2021-02-21 (×2): qty 1

## 2021-02-21 MED ORDER — APIXABAN 2.5 MG PO TABS: 2.5000 mg | ORAL_TABLET | Freq: Two times a day (BID) | ORAL | 0 refills | Status: DC

## 2021-02-21 MED ORDER — TRAMADOL HCL 50 MG PO TABS: 50.0000 mg | ORAL_TABLET | Freq: Four times a day (QID) | ORAL | 0 refills | Status: DC | PRN

## 2021-02-21 MED ORDER — ONDANSETRON HCL 4 MG/2ML IJ SOLN
4.0000 mg | Freq: Once | INTRAMUSCULAR | Status: AC
  Administered 2021-02-21: 4 mg via INTRAVENOUS

## 2021-02-21 MED ORDER — CALCIUM CARBONATE ANTACID 500 MG PO CHEW: 1.0000 | CHEWABLE_TABLET | Freq: Every day | ORAL | Status: DC | PRN

## 2021-02-21 MED ORDER — CARBOXYMETHYLCELLULOSE SODIUM 1 % OP GEL: Freq: Every day | OPHTHALMIC | Status: DC | PRN

## 2021-02-21 MED ORDER — FENTANYL CITRATE (PF) 100 MCG/2ML IJ SOLN
25.0000 ug | INTRAMUSCULAR | Status: DC | PRN
  Administered 2021-02-21: 50 ug via INTRAVENOUS
  Administered 2021-02-21: 25 ug via INTRAVENOUS
  Administered 2021-02-21: 50 ug via INTRAVENOUS

## 2021-02-21 MED ORDER — PANTOPRAZOLE SODIUM 40 MG PO TBEC
40.0000 mg | DELAYED_RELEASE_TABLET | Freq: Every day | ORAL | Status: DC
  Administered 2021-02-22: 40 mg via ORAL
  Filled 2021-02-21: qty 1

## 2021-02-21 MED ORDER — DIPHENHYDRAMINE HCL 12.5 MG/5ML PO ELIX
12.5000 mg | ORAL_SOLUTION | ORAL | Status: DC | PRN
Start: 2021-02-21 — End: 2021-02-22

## 2021-02-21 MED ORDER — KETOROLAC TROMETHAMINE 15 MG/ML IJ SOLN
7.5000 mg | Freq: Four times a day (QID) | INTRAMUSCULAR | Status: AC
  Administered 2021-02-21 – 2021-02-22 (×4): 7.5 mg via INTRAVENOUS
  Filled 2021-02-21 (×4): qty 1

## 2021-02-21 MED ORDER — SODIUM CHLORIDE 0.9 % IV SOLN
INTRAVENOUS | Status: DC | PRN
  Administered 2021-02-21: 60 mL

## 2021-02-21 MED ORDER — ONDANSETRON HCL 4 MG/2ML IJ SOLN
INTRAMUSCULAR | Status: DC | PRN
  Administered 2021-02-21: 4 mg via INTRAVENOUS

## 2021-02-21 MED ORDER — SODIUM CHLORIDE 0.9 % IV SOLN: INTRAVENOUS | Status: DC

## 2021-02-21 MED ORDER — DOCUSATE SODIUM 100 MG PO CAPS
100.0000 mg | ORAL_CAPSULE | Freq: Two times a day (BID) | ORAL | Status: DC
  Administered 2021-02-21 – 2021-02-22 (×2): 100 mg via ORAL
  Filled 2021-02-21 (×2): qty 1

## 2021-02-21 MED ORDER — ONDANSETRON HCL 4 MG/2ML IJ SOLN
INTRAMUSCULAR | Status: AC
  Administered 2021-02-21: 4 mg via INTRAVENOUS
  Filled 2021-02-21: qty 2

## 2021-02-21 MED ORDER — SUGAMMADEX SODIUM 200 MG/2ML IV SOLN
INTRAVENOUS | Status: DC | PRN
  Administered 2021-02-21: 200 mg via INTRAVENOUS

## 2021-02-21 MED ORDER — METOCLOPRAMIDE HCL 10 MG PO TABS
5.0000 mg | ORAL_TABLET | Freq: Three times a day (TID) | ORAL | Status: DC | PRN
Start: 2021-02-21 — End: 2021-02-22

## 2021-02-21 MED ORDER — OXYCODONE HCL 5 MG PO TABS
5.0000 mg | ORAL_TABLET | Freq: Once | ORAL | Status: AC | PRN
Start: 2021-02-21 — End: 2021-02-21
  Administered 2021-02-21: 5 mg via ORAL

## 2021-02-21 SURGICAL SUPPLY — 51 items
BLADE SAW SAG 25X90X1.19 (BLADE) ×2 IMPLANT
BLADE SURG SZ20 CARB STEEL (BLADE) ×2 IMPLANT
BNDG ELASTIC 6X5.8 VLCR NS LF (GAUZE/BANDAGES/DRESSINGS) ×2 IMPLANT
CEMENT BONE R 1X40 (Cement) ×4 IMPLANT
CEMENT VACUUM MIXING SYSTEM (MISCELLANEOUS) ×2 IMPLANT
CHLORAPREP W/TINT 26 (MISCELLANEOUS) ×2 IMPLANT
COMPONENT PATELLAR VGD 7.8X3 (Joint) ×2 IMPLANT
COOLER POLAR GLACIER W/PUMP (MISCELLANEOUS) ×2 IMPLANT
COVER MAYO STAND REUSABLE (DRAPES) ×2 IMPLANT
COVER WAND RF STERILE (DRAPES) ×2 IMPLANT
CUFF TOURN SGL QUICK 24 (TOURNIQUET CUFF) ×1
CUFF TRNQT CYL 24X4X16.5-23 (TOURNIQUET CUFF) ×1 IMPLANT
DRAPE 3/4 80X56 (DRAPES) ×2 IMPLANT
DRAPE IMP U-DRAPE 54X76 (DRAPES) ×2 IMPLANT
DRSG MEPILEX SACRM 8.7X9.8 (GAUZE/BANDAGES/DRESSINGS) ×2 IMPLANT
DRSG OPSITE POSTOP 4X10 (GAUZE/BANDAGES/DRESSINGS) ×2 IMPLANT
ELECT REM PT RETURN 9FT ADLT (ELECTROSURGICAL) ×2
ELECTRODE REM PT RTRN 9FT ADLT (ELECTROSURGICAL) ×1 IMPLANT
FEMORAL CR LEFT 65MM (Joint) ×2 IMPLANT
GAUZE XEROFORM 1X8 LF (GAUZE/BANDAGES/DRESSINGS) ×2 IMPLANT
GLOVE SRG 8 PF TXTR STRL LF DI (GLOVE) ×1 IMPLANT
GLOVE SURG ENC MOIS LTX SZ7.5 (GLOVE) ×8 IMPLANT
GLOVE SURG ENC MOIS LTX SZ8 (GLOVE) ×8 IMPLANT
GLOVE SURG UNDER LTX SZ8 (GLOVE) ×2 IMPLANT
GLOVE SURG UNDER POLY LF SZ8 (GLOVE) ×1
GOWN STRL REUS W/ TWL LRG LVL3 (GOWN DISPOSABLE) ×1 IMPLANT
GOWN STRL REUS W/ TWL XL LVL3 (GOWN DISPOSABLE) ×1 IMPLANT
GOWN STRL REUS W/TWL LRG LVL3 (GOWN DISPOSABLE) ×1
GOWN STRL REUS W/TWL XL LVL3 (GOWN DISPOSABLE) ×1
HOOD PEEL AWAY FLYTE STAYCOOL (MISCELLANEOUS) ×6 IMPLANT
INSERT TIB BEARING 71 10 (Insert) ×2 IMPLANT
IV NS IRRIG 3000ML ARTHROMATIC (IV SOLUTION) ×2 IMPLANT
KIT TURNOVER KIT A (KITS) ×2 IMPLANT
MANIFOLD NEPTUNE II (INSTRUMENTS) ×2 IMPLANT
NEEDLE SPNL 20GX3.5 QUINCKE YW (NEEDLE) ×2 IMPLANT
NS IRRIG 1000ML POUR BTL (IV SOLUTION) ×2 IMPLANT
PACK TOTAL KNEE (MISCELLANEOUS) ×2 IMPLANT
PAD WRAPON POLAR KNEE (MISCELLANEOUS) ×1 IMPLANT
PENCIL SMOKE EVACUATOR (MISCELLANEOUS) ×2 IMPLANT
PLATE KNEE TIBIAL 71MM FIXED (Plate) ×2 IMPLANT
PULSAVAC PLUS IRRIG FAN TIP (DISPOSABLE) ×2
STAPLER SKIN PROX 35W (STAPLE) ×2 IMPLANT
SUCTION FRAZIER HANDLE 10FR (MISCELLANEOUS) ×1
SUCTION TUBE FRAZIER 10FR DISP (MISCELLANEOUS) ×1 IMPLANT
SUT VIC AB 0 CT1 36 (SUTURE) ×6 IMPLANT
SUT VIC AB 2-0 CT1 27 (SUTURE) ×3
SUT VIC AB 2-0 CT1 TAPERPNT 27 (SUTURE) ×3 IMPLANT
SYR 10ML LL (SYRINGE) ×2 IMPLANT
SYR 20ML LL LF (SYRINGE) ×2 IMPLANT
TIP FAN IRRIG PULSAVAC PLUS (DISPOSABLE) ×1 IMPLANT
WRAPON POLAR PAD KNEE (MISCELLANEOUS) ×2

## 2021-02-21 NOTE — Anesthesia Procedure Notes (Signed)
Procedure Name: Intubation Date/Time: 02/21/2021 8:19 AM Performed by: Lily Peer, Trampus Mcquerry, CRNA Pre-anesthesia Checklist: Patient identified, Emergency Drugs available, Suction available and Patient being monitored Patient Re-evaluated:Patient Re-evaluated prior to induction Oxygen Delivery Method: Circle system utilized Preoxygenation: Pre-oxygenation with 100% oxygen Induction Type: IV induction Ventilation: Mask ventilation without difficulty Laryngoscope Size: McGraph and 3 Grade View: Grade I Tube type: Oral Tube size: 7.0 mm Number of attempts: 1 Airway Equipment and Method: Stylet and Oral airway Placement Confirmation: ETT inserted through vocal cords under direct vision,  positive ETCO2 and breath sounds checked- equal and bilateral Secured at: 22 cm Tube secured with: Tape Dental Injury: Teeth and Oropharynx as per pre-operative assessment

## 2021-02-21 NOTE — Progress Notes (Signed)
Pt A/O x4, received from PACU s/p LTKA, cryotherapy, CPM, SCD, teds in place.

## 2021-02-21 NOTE — Plan of Care (Signed)

## 2021-02-21 NOTE — Anesthesia Preprocedure Evaluation (Addendum)
Anesthesia Evaluation  Patient identified by MRN, date of birth, ID band Patient awake    Reviewed: Allergy & Precautions, H&P , NPO status , Patient's Chart, lab work & pertinent test results  History of Anesthesia Complications Negative for: history of anesthetic complications  Airway Mallampati: III  TM Distance: <3 FB Neck ROM: full    Dental  (+) Chipped   Pulmonary neg shortness of breath, former smoker,    Pulmonary exam normal        Cardiovascular Exercise Tolerance: Good (-) angina(-) Past MI and (-) DOE negative cardio ROS Normal cardiovascular exam     Neuro/Psych  Headaches,  Neuromuscular disease negative psych ROS   GI/Hepatic Neg liver ROS, GERD  Medicated and Controlled,  Endo/Other  negative endocrine ROS  Renal/GU      Musculoskeletal  (+) Arthritis ,   Abdominal   Peds  Hematology negative hematology ROS (+)   Anesthesia Other Findings Past Medical History: No date: Atrophic vaginitis 2001: Breast cancer ([place])     Comment:  right breast ca with lumpectomy and rad tx.  No date: Cancer [facility])     Comment:  skin- squamous and basil cell  No date: GERD (gastroesophageal reflux disease) No date: Headache     Comment:  2-3x/week.  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, extra-mammary 2001: Personal history of radiation therapy     Comment:  BREAST CA No date: Plantar fibromatosis No date: Tinnitus No date: Vertigo     Comment:  several months ago  Past Surgical History: 09/14/2019: ANTERIOR AND POSTERIOR VAGINAL REPAIR     Comment:  [facility] No date: BASAL CELL CARCINOMA EXCISION 09/14/2019: BLADDER SUSPENSION     Comment:  [facility] 2001: BREAST BIOPSY; Left     Comment:  benign, marked placed 2001: BREAST BIOPSY; Right     Comment:  positive No date: BREAST  LUMPECTOMY; Right     Comment:  2001 with radiation 03/15/2020: CATARACT EXTRACTION W/PHACO; Left     Comment:  Procedure: CATARACT EXTRACTION PHACO AND INTRAOCULAR               LENS PLACEMENT (IOC) LEFT 10.91 01:01.4;  Surgeon:               Birder Robson, MD;  Location: [facility];                Service: Ophthalmology;  Laterality: Left; 06/10/2020: COLONOSCOPY WITH PROPOFOL; N/A     Comment:  Procedure: COLONOSCOPY WITH PROPOFOL;  Surgeon:               Lesly Rubenstein, MD;  Location: [facility] ENDOSCOPY;                Service: Endoscopy;  Laterality: N/A; No date: left eye     Comment:  reconstructive from skin cancer 2001: MASTECTOMY; Right     Comment:  partial 09/14/2019: VAGINAL HYSTERECTOMY     Comment:  [facility] 2019: VULVECTOMY PARTIAL  BMI    Body Mass Index: 26.68 kg/m      Reproductive/Obstetrics negative OB ROS                             Anesthesia Physical Anesthesia Plan  ASA: II  Anesthesia Plan: Spinal   Post-op Pain Management:    Induction:   PONV Risk Score  and Plan:   Airway Management Planned: Natural Airway and Nasal Cannula  Additional Equipment:   Intra-op Plan:   Post-operative Plan:   Informed Consent: I have reviewed the patients History and Physical, chart, labs and discussed the procedure including the risks, benefits and alternatives for the proposed anesthesia with the patient or authorized representative who has indicated his/her understanding and acceptance.     Dental Advisory Given  Plan Discussed with: Anesthesiologist, CRNA and Surgeon  Anesthesia Plan Comments: (Patient reports history of corneal abrasions in left eye 2/2 no being able to fully close that eye 2/2 skin surgery.  She requested and we plan to place her night eye lubrication in left eye and cover with an eye patch.  Patient reports no bleeding problems and no anticoagulant use.  Plan for spinal with backup GA  Patient  consented for risks of anesthesia including but not limited to:  - adverse reactions to medications - damage to eyes, teeth, lips or other oral mucosa - nerve damage due to positioning  - risk of bleeding, infection and or nerve damage from spinal that could lead to paralysis - risk of headache or failed spinal - damage to teeth, lips or other oral mucosa - sore throat or hoarseness - damage to heart, brain, nerves, lungs, other parts of body or loss of life  Patient voiced understanding.)       Anesthesia Quick Evaluation

## 2021-02-21 NOTE — Transfer of Care (Signed)
Immediate Anesthesia Transfer of Care Note  Patient: Dorothy Turner  Procedure(s) Performed: TOTAL KNEE ARTHROPLASTY (Left Knee)  Patient Location: PACU  Anesthesia Type:General and Spinal  Level of Consciousness: drowsy  Airway & Oxygen Therapy: Patient connected to face mask oxygen  Post-op Assessment: Report given to RN  Post vital signs: stable  Last Vitals:  Vitals Value Taken Time  BP 137/65 02/21/21 1016  Temp    Pulse 82 02/21/21 1018  Resp 14 02/21/21 1018  SpO2 100 % 02/21/21 1018  Vitals shown include unvalidated device data.  Last Pain:  Vitals:   02/21/21 0635  TempSrc: Temporal  PainSc: 0-No pain         Complications: No complications documented.

## 2021-02-21 NOTE — H&P (Addendum)
History of Present Illness: Dorothy Turner is a 75 y.o. who presents today for history and physical.  The patient is to undergo a left total knee arthroplasty on 01/24/2021.  The patient was last seen in the clinic on 05/09/2020.  She was scheduled for surgery on a couple occasions but had to cancel these for several different reasons.  The patient has a chronic history of left knee pain.  Prior to being seen on 05/09/2020 the patient was last seen for her left knee symptoms in September, 2018. At this visit, she received a steroid injection which she states provided moderate relief of her symptoms, lasting several months before her symptoms began to recur. The patient did not return for follow-up as she had other medical issues to attend to. Most recently, she has been having some right-sided lower back pain. Her physical therapist states that she has been overcompensating for her left knee pain which has been throwing off her back, prompting the patient to make this appointment. She notes mild to moderate pain in her knee which she rates at 2-3/10. She has been taking turmeric on a daily basis for the discomfort. She had been taking Mobic, but this caused some bleeding in her stools so she stopped taking this medication. She notes a popping/cracking sensation in her knee, especially with ascending/descending stairs or when getting up from a squatting or kneeling position. She denies any reinjury to the knee, and denies any numbness or paresthesias down her leg to her foot.  The patient states that the pain has increased to the point where it is significantly impairing her activities of daily living and wishes to proceed with having surgery.  Past Medical History: . Aching pain, unspecified 04/01/2019  . ANA positive 03/28/2015   . Ankle joint stiffness, unspecified laterality 06/11/2019  . Anxiety 06/11/2019  . At high risk for falls 03/04/2019  . Atrophic vaginitis  . Constipation 02/20/2018  .  Dizziness 03/04/2019  . Dysuria 02/20/2018  . Elevated C-reactive protein 06/11/2019  . Exposure to mold 06/11/2019  . GERD (gastroesophageal reflux disease)  . Headache 04/01/2019  . Heart palpitations 02/09/2016  . 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 High serum ferritin 03/25/2020  . Hyperlipidemia  . IBS (irritable bowel syndrome)  . Insomnia 03/04/2019  . 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 Low back pain 02/26/2020  . Malignant neoplasm of breast (CMS-HCC)  . Mixed incontinence 08/14/2019  . Pelvic floor dysfunction 04/01/2019  . Plantar fibromatosis  . Postoperative pain 02/26/2020  . Restless legs 04/01/2019  . Serum iron raised 03/25/2020  . Skin cancer, basal cell (multiple)  . 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 Tinnitus 03/04/2019  . Uterovaginal prolapse 02/20/2018  . Vitamin B12 deficiency (non anemic) 06/11/2019  . Vitamin D deficiency 06/11/2019   Past Surgical History: . basal cell carcinoma excision (multiple removed from face and ear)  . COLONOSCOPY 06/10/2020 (Normal Colon/FHx CC/Repeat 44yrs/CTL)  . MASTECTOMY Right 2001 (partial)  . squamous cell carcinoma excision (multiple removed from face and ear)   Past Family History: . Heart disease Mother  . Colon cancer Mother  . Atrial fibrillation (Abnormal heart rhythm sometimes requiring treatment with blood thinners) Mother  . High blood pressure (Hypertension) Mother  . COPD Father   Medications: . acetaminophen (TYLENOL) 500 MG tablet Take 1,000 mg by mouth 2 (two) times daily  . aspirin 325 MG tablet Take 650 mg by mouth every 6 (six) hours as needed  .  cholecalciferol (VITAMIN D3) 2,000 unit capsule Take 2,000 Units by mouth once daily.  . cyanocobalamin (VITAMIN B12) 1000 MCG tablet Take 1 tablet by mouth once daily  . valACYclovir (VALTREX) 1000 MG tablet Take 1 tablet by mouth once daily  . zolpidem (AMBIEN) 5 MG tablet Take 5 mg by mouth nightly as needed for Sleep.  Marland Kitchen ashwagandha  root extract 300 mg Cap Take 1 tablet by mouth once daily  . calcium carbonate (TUMS) 200 mg calcium (500 mg) chewable tablet Take by mouth  . docosahexaenoic acid-epa 120-180 mg Cap Take 1 tablet by mouth once daily  . L.acid/L.casei/B.bif/B.lon/FOS (PROBIOTIC BLEND ORAL) Take by mouth. (Patient not taking: Reported on 01/18/2021 )  . niacin 100 MG tablet Take 1 capsule by mouth once daily (Patient not taking: Reported on 01/18/2021 )   Allergies: . Ciprofloxacin Muscle Pain  . Mobic [Meloxicam] Other (Blood in stool)   Review of Systems A comprehensive 14 point ROS was performed, reviewed, and the pertinent orthopaedic findings are documented in the HPI.  Physical Exam: BP 118/86 (BP Location: Left upper arm, Patient Position: Sitting, BP Cuff Size: Adult)  Ht 165.1 cm (5\' 5" )  Wt 69.2 kg (152 lb 9.6 oz)  BMI 25.39 kg/m   General: Well-developed well-nourished female seen in no acute distress.   HEENT: Atraumatic,normocephalic. Pupils are equal and reactive to light. Oropharynx is clear with moist mucosa  Lungs: Clear to auscultation bilaterally   Cardiovascular: Regular rate and rhythm. Normal S1, S2. No murmurs. No appreciable gallops or rubs. Peripheral pulses are palpable.  Abdomen: Soft, non-tender, nondistended. Bowel sounds present  Extremity: Leftknee exam: GAIT:mild limpand uses no assistive devices. ALIGNMENT:mild valgus SKIN:unremarkable SWELLING:minimal EFFUSION:small WARMTH:no warmth TENDERNESS:mildover the lateral joint line and medial joint line ROM:0 to 125 degrees with mild pain in maximal flexion McMURRAY'S:negative PATELLOFEMORAL:normal tracking with no peri-patellar tenderness and negative apprehension sign CREPITUS:Mild patellofemoral crepitance LACHMAN'S:negative PIVOT SHIFT:negative ANTERIOR DRAWER:negative POSTERIOR DRAWER:negative VARUS/VALGUS:positive pseudolaxity to valgus stressing  Neurological: The patient is alert  and oriented Sensation to light touch appears to be intact and within normal limits Gross motor strength appeared to be equal to 5/5  Vascular : Peripheral pulses felt to be palpable. Capillary refill appears to be intact and within normal limits  X-ray Imaging: AP standing, lateral and sunrise view of the left knee ordered and interpreted on today's visit demonstrate shows significant tricompartmental degenerative arthrosis. She is noted to have osteophytes as well as subchondral sclerosis to the medial and lateral compartment. On the lateral view she does appear to be bone-on-bone. Patella appears to be tracking well.   Impression: 1. Degenerative arthrosis, left knee.  Plan: The treatment options, including both surgical and nonsurgical choices, have been discussed in detail with the patient. The patient would like to proceed with surgical intervention to include a left total knee arthroplasty. The risks (including bleeding, infection, nerve and/or blood vessel injury, persistent or recurrent pain, loosening or failure of the components, need for further surgery, blood clots, strokes, heart attacks or arrhythmias, pneumonia, etc.) and benefits of the surgical procedure were discussed. The patient states her understanding and agrees to proceed. She agrees to a blood transfusion if necessary. A formal written consent will be obtained by the nursing staff.   H&P reviewed and patient re-examined. No changes.

## 2021-02-21 NOTE — Evaluation (Signed)
Physical Therapy Evaluation Patient Details Name: Dorothy Turner MRN: [id number] DOB: October 31, 1946 Today's Date: 02/21/2021   History of Present Illness  admitted for acute hospitalization s/p L TKA, WBAT (02/21/21)  Clinical Impression  Patient just arrived to unit upon arrival to room; alert and oriented, follows commands and agreeable to participation with session as tolerated.  Rates L knee pain 3/10 at rest, 5/10 with movement; meds received prior to session.  Residual paresthesia noted L mid-calf distally with limited active L ankle DF (1/5); will monitor for full return in subsequent sessions.  L LE otherwise grossly 3-/5 in strength with fair post-op ROM (5-68 degrees) at L knee; generally guarded and limited by pain.  Currently requiring min/mod assist for bed mobility; close sup for sitting balance.  Patient declined attempts at standing, OOB or gait due to pain, nausea and persistent sensory deficits L LE.  Will continue to assess/progress in subsequent sessions as medically appropriate. Would benefit from skilled PT to address above deficits and promote optimal return to PLOF.; formal discharge recommendations TBD after additional mobility assessment tomorrow AM.    Follow Up Recommendations  (TBD pending additional mobility/OOB assessment)    Equipment Recommendations       Recommendations for Other Services       Precautions / Restrictions Precautions Precautions: Fall Restrictions Weight Bearing Restrictions: Yes LLE Weight Bearing: Weight bearing as tolerated      Mobility  Bed Mobility Overal bed mobility: Needs Assistance Bed Mobility: Supine to Sit;Sit to Supine     Supine to sit: Min assist;Mod assist Sit to supine: Min assist;Mod assist   General bed mobility comments: assist for L LE management    Transfers                 General transfer comment: declined due to pain, nausea and persistent numbness L LE  Ambulation/Gait              General Gait Details: declined due to pain, nausea and persistent numbness L LE  Stairs            Wheelchair Mobility    Modified Rankin (Stroke Patients Only)       Balance Overall balance assessment: Needs assistance Sitting-balance support: No upper extremity supported;Feet supported Sitting balance-Leahy Scale: Good                                       Pertinent Vitals/Pain Pain Assessment: 0-10 Pain Score: 3  Pain Location: L knee Pain Descriptors / Indicators: Aching;Grimacing;Guarding Pain Intervention(s): Limited activity within patient's tolerance;Monitored during session;Premedicated before session;Repositioned    Home Living Family/patient expects to be discharged to:: Private residence Living Arrangements: Spouse/significant other Available Help at Discharge: Family Type of Home: House Home Access: Stairs to enter   Technical brewer of Steps: 1 Home Layout: One level Home Equipment: None      Prior Function Level of Independence: Independent         Comments: Indep with ADLs, household and community mobilization upon discharge; denies fall history.     Hand Dominance        Extremity/Trunk Assessment   Upper Extremity Assessment Upper Extremity Assessment: Overall WFL for tasks assessed    Lower Extremity Assessment Lower Extremity Assessment: Generalized weakness (L knee grossly 3-/5, L ankle DF 1/5, L ankel PF 3-/5; residual paresthesia still present mid-calf distally)  Communication   Communication: No difficulties  Cognition Arousal/Alertness: Awake/alert Behavior During Therapy: WFL for tasks assessed/performed;Flat affect Overall Cognitive Status: Within Functional Limits for tasks assessed                                        General Comments      Exercises Total Joint Exercises Goniometric ROM: L knee: 5-68 degrees, limited by pain Other Exercises Other Exercises:  Supine L LE therex, 1x10, act assist ROM: ankle pumps, quad sets, SAQs, heel slides, hip abduct/adduct, SLR.  Fair isolated movement, except L ankle DF. Other Exercises: Seated L LE knee flexion, 1x10, act assist rom   Assessment/Plan    PT Assessment Patient needs continued PT services  PT Problem List Decreased strength;Decreased range of motion;Decreased activity tolerance;Decreased balance;Decreased coordination;Decreased cognition;Decreased mobility;Decreased knowledge of use of DME;Decreased safety awareness;Decreased knowledge of precautions;Pain       PT Treatment Interventions Gait training;Stair training;Functional mobility training;Therapeutic activities;DME instruction;Therapeutic exercise;Balance training;Patient/family education    PT Goals (Current goals can be found in the Care Plan section)  Acute Rehab PT Goals Patient Stated Goal: to return home PT Goal Formulation: With patient/family Time For Goal Achievement: 03/07/21 Potential to Achieve Goals: Good    Frequency BID   Barriers to discharge        Co-evaluation               AM-PAC PT "6 Clicks" Mobility  Outcome Measure Help needed turning from your back to your side while in a flat bed without using bedrails?: A Little Help needed moving from lying on your back to sitting on the side of a flat bed without using bedrails?: A Lot Help needed moving to and from a bed to a chair (including a wheelchair)?: A Lot Help needed standing up from a chair using your arms (e.g., wheelchair or bedside chair)?: A Lot Help needed to walk in hospital room?: A Lot Help needed climbing 3-5 steps with a railing? : A Lot 6 Click Score: 13    End of Session   Activity Tolerance: Patient limited by pain Patient left: in bed;with call bell/phone within reach;with bed alarm set Nurse Communication: Mobility status PT Visit Diagnosis: Muscle weakness (generalized) (M62.81);Difficulty in walking, not elsewhere classified  (R26.2);Pain Pain - Right/Left: Left Pain - part of body: Knee    Time: 5885-0277 PT Time Calculation (min) (ACUTE ONLY): 28 min   Charges:   PT Evaluation $PT Eval Moderate Complexity: 1 Mod PT Treatments $Therapeutic Exercise: 8-22 mins       Jhoan Schmieder H. Owens Shark, PT, DPT, NCS 02/21/21, 4:59 PM [phone]

## 2021-02-21 NOTE — Discharge Instructions (Addendum)
Orthopedic discharge instructions: May shower with intact OpSite dressing.  Apply ice frequently to knee or use Polar Care. Start Eliquis 2.5 mg p.o. twice daily on Wednesday, 02/22/2021, for 2 weeks, then take aspirin 325 mg daily for 4 weeks. Take hydrocodone and/or tramadol as prescribed when needed.  May supplement with ES Tylenol if necessary. May weight-bear as tolerated on left leg -use walker for balance and support. Follow-up in 10-14 days or as scheduled.   Apixaban PROPHYLAXIS - ORTHO 1/15 Information on my medicine - ELIQUIS (apixaban) This medication education was reviewed with me or my healthcare representative as part of my discharge preparation. The pharmacist that spoke with me during my hospital stay was: ____________________________ (pharmacist name) WHY WAS [place]? Eliquis was prescribed for you to reduce the risk of blood clots forming after orthopedic surgery. WHAT DO YOU NEED TO KNOW ABOUT ELIQUIS? Take your Eliquis TWICE DAILY - one tablet in the morning and one tablet in the evening with or without food. It would be best to take the dose about the same time each day. If you have difficulty swallowing the tablet whole please discuss with your pharmacist how to take the medication safely. Take Eliquis exactly as prescribed by your doctor and DO NOT stop taking Eliquis without talking to the doctor who prescribed the medication. Stopping without other medication to take the place of Eliquis may increase your risk of developing a clot. After discharge, you should have regular check-up appointments with your healthcare provider that is prescribing your Eliquis. WHAT DO YOU DO IF YOU MISS A DOSE? If a dose of ELIQUIS is not taken at the scheduled time, take it as soon as possible on the same day and twice-daily administration should be resumed. The dose should not be doubled to make up for a missed dose. Do not take more than one tablet  of ELIQUIS at the same time. IMPORTANT SAFETY INFORMATION A possible side effect of Eliquis is bleeding. You should call your healthcare provider right away if you experience any of the following: ? Bleeding from an injury or your nose that does not stop. ? Unusual colored urine (red or dark brown) or unusual colored stools (red or black). ? Unusual bruising for unknown reasons. ? A serious fall or if you hit your head (even if there is no bleeding). Some medicines may interact with Eliquis and might increase your risk of bleeding or clotting while on Eliquis. To help avoid this, consult your healthcare provider or pharmacist prior to using any new prescription or non-prescription medications, including herbals, vitamins, non-steroidal anti-inflammatory drugs (NSAIDs) and supplements. This website has more information on Eliquis (apixaban): [email or web address].

## 2021-02-21 NOTE — Op Note (Signed)
02/21/2021  10:13 AM  Patient:   Dorothy Turner  Pre-Op Diagnosis:   Degenerative joint disease, left knee.  Post-Op Diagnosis:   Same  Procedure:   Left TKA using all-cemented Bi[employer] Vanguard system with a 65 mm PCR femur, a 71 mm tibial tray with a 10 mm anterior stabilized E-poly insert, and a 34 x 7.8 mm all-poly 3-pegged domed patella.  Surgeon:   Pascal Lux, MD  Assistant:   Cameron Proud, PA-C; Marijean Bravo, PA-S  Anesthesia:   Spinal --> GET  Findings:   As above  Complications:   None  EBL:   5 cc  Fluids:   600 cc crystalloid  UOP:   None  TT:   100 minutes at 300 mmHg  Drains:   None  Closure:   Staples  Implants:   As above  Brief Clinical Note:   The patient is a 75 year old female with a long history of progressively worsening left knee pain. The patient's symptoms have progressed despite medications, activity modification, injections, etc. The patient's history and examination were consistent with advanced degenerative joint disease of the left knee confirmed by plain radiographs. The patient presents at this time for a left total knee arthroplasty.  Procedure:   The patient was brought into the operating room. After adequate spinal anesthesia was obtained, the patient was lain in the supine position before the left lower extremity was prepped with ChloraPrep solution and draped sterilely. Preoperative antibiotics were administered. After verifying the proper laterality with a surgical timeout, the limb was exsanguinated with an Esmarch and the tourniquet inflated to 300 mmHg.   A standard anterior approach to the knee was made through an approximately 7 inch incision. The incision was carried down through the subcutaneous tissues to expose superficial retinaculum. This was split the length of the incision and the medial flap elevated sufficiently to expose the medial retinaculum. The medial retinaculum was incised, leaving a 3-4 mm cuff of tissue on the  patella. This was extended distally along the medial border of the patellar tendon and proximally through the medial third of the quadriceps tendon. A subtotal fat pad excision was performed before the soft tissues were elevated off the anteromedial and anterolateral aspects of the proximal tibia to the level of the collateral ligaments. The anterior portions of the medial and lateral menisci were removed, as was the anterior cruciate ligament. With the knee flexed to 90, the external tibial guide was positioned and the appropriate proximal tibial cut made. This piece was taken to the back table where it was measured and found to be optimally replicated by a 71 mm component.  Attention was directed to the distal femur. The intramedullary canal was accessed through a 3/8" drill hole. The intramedullary guide was inserted and positioned in order to obtain a neutral flexion gap. The intercondylar block was positioned with care taken to avoid notching the anterior cortex of the femur. The appropriate cut was made. Next, the distal cutting block was placed at 6 of valgus alignment. Using the 9 mm slot, the distal cut was made. The distal femur was measured and found to be optimally replicated by the 65 mm component. The 65 mm 4-in-1 cutting block was positioned and first the posterior, then the posterior chamfer, the anterior chamfer, and finally the anterior cuts were made. At this point, the posterior portions medial and lateral menisci were removed. A trial reduction was performed using the appropriate femoral and tibial components with the 10 mm  insert. This demonstrated excellent stability to varus and valgus stressing both in flexion and extension while permitting full extension. Patella tracking was assessed and found to be excellent. Therefore, the tibial guide position was marked on the proximal tibia. The patella thickness was measured and found to be 20 mm. Therefore, the appropriate cut was made. The  patellar surface was measured and found to be optimally replicated by the 34 mm component. The three peg holes were drilled in place before the trial button was inserted. Patella tracking was assessed and found to be excellent, passing the "no thumb test". The lug holes were drilled into the distal femur before the trial component was removed, leaving only the tibial tray. The keel was then created using the appropriate tower, reamer, and punch.  The bony surfaces were prepared for cementing by irrigating them thoroughly with sterile saline solution via the jet lavage system. A bone plug was fashioned from some of the bone that had been removed previously and used to plug the distal femoral canal. In addition, 20 cc of Exparel diluted out to 60 cc with normal saline and 30 cc of 0.5% Sensorcaine were injected into the postero-medial and postero-lateral aspects of the knee, the medial and lateral gutter regions, and the peri-incisional tissues to help with postoperative analgesia. Meanwhile, the cement was being mixed on the back table. When it was ready, the tibial tray was cemented in first. The excess cement was removed using [employer] fast streamer. Next, the femoral component was impacted into place. Again, the excess cement was removed using [employer] fast streamer. The 10 mm trial insert was positioned and the knee brought into extension while the cement hardened. Finally, the patella was cemented into place and secured using the patellar clamp. Again, the excess cement was removed using [employer] fast streamer. Once the cement had hardened, the knee was placed through a range of motion with the findings as described above. Therefore, the trial insert was removed and, after verifying that no cement had been retained posteriorly, the permanent 10 mm anterior stabilized E-polyethylene insert was positioned and secured using the appropriate key locking mechanism. Again the knee was placed through a range of motion with the findings as  described above.  The wound was copiously irrigated with sterile saline solution using the jet lavage system before the quadriceps tendon and retinacular layer were reapproximated using #0 Vicryl interrupted sutures. The superficial retinacular layer also was closed using a running #0 Vicryl suture. A total of 10 cc of transexemic acid (TXA) was injected intra-articularly before the subcutaneous tissues were closed in several layers using 2-0 Vicryl interrupted sutures. The skin was closed using staples. A sterile honeycomb dressing was applied to the skin before the leg was wrapped with an Ace wrap to accommodate the Polar Care device. The patient was then awakened and returned to the recovery room in satisfactory condition after tolerating the procedure well.

## 2021-02-22 ENCOUNTER — Encounter: Payer: Self-pay | Admitting: Surgery

## 2021-02-22 DIAGNOSIS — M1712 Unilateral primary osteoarthritis, left knee: Secondary | ICD-10-CM | POA: Diagnosis not present

## 2021-02-22 LAB — CBC
HCT: 27.3 % — ABNORMAL LOW (ref 36.0–46.0)
Hemoglobin: 9.5 g/dL — ABNORMAL LOW (ref 12.0–15.0)
MCH: 37.1 pg — ABNORMAL HIGH (ref 26.0–34.0)
MCHC: 34.8 g/dL (ref 30.0–36.0)
MCV: 106.6 fL — ABNORMAL HIGH (ref 80.0–100.0)
Platelets: 302 10*3/uL (ref 150–400)
RBC: 2.56 MIL/uL — ABNORMAL LOW (ref 3.87–5.11)
RDW: 13 % (ref 11.5–15.5)
WBC: 10.4 10*3/uL (ref 4.0–10.5)
nRBC: 0 % (ref 0.0–0.2)

## 2021-02-22 LAB — BASIC METABOLIC PANEL
Anion gap: 9 (ref 5–15)
BUN: 9 mg/dL (ref 8–23)
CO2: 24 mmol/L (ref 22–32)
Calcium: 8.4 mg/dL — ABNORMAL LOW (ref 8.9–10.3)
Chloride: 104 mmol/L (ref 98–111)
Creatinine, Ser: 0.69 mg/dL (ref 0.44–1.00)
GFR, Estimated: >60 mL/min (ref >60)
Glucose, Bld: 116 mg/dL — ABNORMAL HIGH (ref 70–99)
Potassium: 3.6 mmol/L (ref 3.5–5.1)
Sodium: 137 mmol/L (ref 135–145)

## 2021-02-22 MED ORDER — ALPRAZOLAM 0.25 MG PO TABS
0.2500 mg | ORAL_TABLET | Freq: Three times a day (TID) | ORAL | Status: DC | PRN
  Administered 2021-02-22: 0.25 mg via ORAL
  Filled 2021-02-22: qty 1

## 2021-02-22 MED ORDER — ONDANSETRON HCL 4 MG PO TABS: 4.0000 mg | ORAL_TABLET | Freq: Four times a day (QID) | ORAL | 1 refills | Status: DC | PRN

## 2021-02-22 NOTE — Plan of Care (Addendum)
Pt alert and oriented on room air. POD1 for L TKA. Pain controlled with scheduled and PRN medication  0150: CPM stopped after 8 hour of use. Pt reported nausea, and pain. IV Zofran given with Norco.  0230; pt reported chest Fullness, denies chest pain, denied history of anxiety. Vitals WDL, mildly tachy.  Dr. Posey Pronto paged. Pt has Anxiety in history,verbal order for 0.25mg  Xanax given and administered, EKG ordered. EKG showed Normal sinus with beats at 99. Pt reports relief. Falls precautions remained in place. Call bell within reach   0650. Foley removed. Trial and void starts Problem: Activity: Goal: Ability to avoid complications of mobility impairment will improve Outcome: Progressing Goal: Range of joint motion will improve Outcome: Progressing   Problem: Clinical Measurements: Goal: Postoperative complications will be avoided or minimized Outcome: Progressing   Problem: Pain Management: Goal: Pain level will decrease with appropriate interventions Outcome: Progressing   Problem: Skin Integrity: Goal: Will show signs of wound healing Outcome: Progressing

## 2021-02-22 NOTE — Progress Notes (Signed)
PT Cancellation Note  Patient Details Name: Dorothy Turner MRN: [id number] DOB: 10/24/1946   Cancelled Treatment:    Reason Eval/Treat Not Completed: Other (comment)   Offered BID session.  Pt had returned to bed due to fatigue.  Stated she felt comfortable with mobility and did not feel she needed further PT intervention at this time.  Will have someone check with her later this pm if she is still here but do not anticipate any further needs from PT.   Chesley Noon 02/22/2021, 11:40 AM

## 2021-02-22 NOTE — Plan of Care (Signed)

## 2021-02-22 NOTE — Progress Notes (Signed)
Met with the patient and her spouse in the room She lives at home with her spouse She has a rolling walker at home and a 3 in 1 She is set up with [facility] PT  Code 39 was given and signed,  No additional needs

## 2021-02-22 NOTE — Progress Notes (Signed)
  Subjective: 1 Day Post-Op Procedure(s) (LRB): TOTAL KNEE ARTHROPLASTY (Left) Patient reports pain as mild.   Patient is well, did have nausea and vomiting yesterday which has improved. Plan is to go Home after hospital stay. Negative for chest pain and shortness of breath Fever: no Gastrointestinal: Nausea and vomiting has improved this AM, is passing gas without pain. Foley was removed this AM.  Objective: Vital signs in last 24 hours: Temp:  [97.7 F (36.5 C)-98.4 F (36.9 C)] 97.9 F (36.6 C) (04/13 0516) Pulse Rate:  [83-125] 96 (04/13 0516) Resp:  [9-32] 17 (04/13 0516) BP: (96-147)/(65-87) 120/77 (04/13 0516) SpO2:  [88 %-100 %] 96 % (04/13 0516)  Intake/Output from previous day:  Intake/Output Summary (Last 24 hours) at 02/22/2021 0730 Last data filed at 02/22/2021 0600 Gross per 24 hour  Intake 2020 ml  Output 1755 ml  Net 265 ml    Intake/Output this shift: No intake/output data recorded.  Labs: Recent Labs    02/22/21 0408  HGB 9.5*   Recent Labs    02/22/21 0408  WBC 10.4  RBC 2.56*  HCT 27.3*  PLT 302   Recent Labs    02/22/21 0408  NA 137  K 3.6  CL 104  CO2 24  BUN 9  CREATININE 0.69  GLUCOSE 116*  CALCIUM 8.4*   No results for input(s): LABPT, INR in the last 72 hours.   EXAM General - Patient is Alert, Appropriate and Oriented Extremity - ABD soft Sensation intact distally Intact pulses distally Dorsiflexion/Plantar flexion intact Incision: scant drainage No cellulitis present  Negative homans to bilateral lower extremities. Dressing/Incision - blood tinged drainage, honeycomb and ACE wrap intact. Motor Function - intact, moving foot and toes well on exam. Able to dorsiflex and planarflex the ankle, sensation intact to both feet. Abdomen is soft without distention.  Past Medical History:  Diagnosis Date  . Atrophic vaginitis   . Breast cancer ([place]) 2001   right breast ca with lumpectomy and rad tx.   . Cancer ([place])     skin- squamous and basil cell   . GERD (gastroesophageal reflux disease)   . Headache    2-3x/week.  Stress  . Hemochromatosis   . Hyperlipidemia   . IBS (irritable bowel syndrome)   . Malignant neoplasm of breast ([place])   . Paget disease, extra-mammary   . Personal history of radiation therapy 2001   BREAST CA  . Plantar fibromatosis   . Tinnitus   . Vertigo    several months ago    Assessment/Plan: 1 Day Post-Op Procedure(s) (LRB): TOTAL KNEE ARTHROPLASTY (Left) Active Problems:   Total knee replacement status, left   Status post total knee replacement using cement, left  Estimated body mass index is 26.68 kg/m as calculated from the following:   Height as of this encounter: 5' 3.5" (1.613 m).   Weight as of this encounter: 69.4 kg. Advance diet Up with therapy D/C IV fluids when tolerating po intake.  Labs reviewed this AM. Advance to heart healthy diet, encourage patient to eat slowly. Intact bowel sounds, no significant abdominal distention, no pain with palpation. Monitor nausea. Up with therapy today.   Plan for possible discharge home today pending progress with PT.  DVT Prophylaxis - Foot Pumps, TED hose and Eliquis Weight-Bearing as tolerated to left leg  J. Cameron Proud, PA-C [facility] 02/22/2021, 7:30 AM

## 2021-02-22 NOTE — Care Management Obs Status (Signed)
[place] NOTIFICATION   Patient Details  Name: EMARIE PAUL MRN: [id number] Date of Birth: 01/14/1946   Medicare Observation Status Notification Given:  Yes    Jovanni Rash Jen Mow, RN 02/22/2021, 8:38 AM

## 2021-02-22 NOTE — Progress Notes (Signed)
Physical Therapy Treatment Patient Details Name: Dorothy Turner MRN: [id number] DOB: [birth year] Today's Date: 02/22/2021    History of Present Illness admitted for acute hospitalization s/p L TKA, WBAT (02/21/21)    PT Comments    Participated in exercises as described below.  HEP given and reviewed.  Stood and was able to walk 23' with slow steady gait.  To gym for step training with RW.  Pt with no LOB or buckling. To bathroom to void.  Answered questions and expectations on recovery.  Encouraged +1 assist from husband initially upon discharge for safety and voiced understanding.  No further questions or concerns in regards to therapy.   Follow Up Recommendations  Home health PT     Equipment Recommendations       Recommendations for Other Services       Precautions / Restrictions Precautions Precautions: Fall Restrictions Weight Bearing Restrictions: Yes LLE Weight Bearing: Weight bearing as tolerated    Mobility  Bed Mobility               General bed mobility comments: in chair before and after session    Transfers Overall transfer level: Needs assistance Equipment used: Rolling walker (2 wheeled) Transfers: Sit to/from Stand Sit to Stand: Min assist;Min guard            Ambulation/Gait Ambulation/Gait assistance: Min guard;Min assist Gait Distance (Feet): 90 Feet Assistive device: Rolling walker (2 wheeled) Gait Pattern/deviations: Step-to pattern;Step-through pattern;Decreased stance time - left Gait velocity: decreased   General Gait Details: overall does well, steady with no buckling   Stairs             Wheelchair Mobility    Modified Rankin (Stroke Patients Only)       Balance Overall balance assessment: Needs assistance Sitting-balance support: No upper extremity supported;Feet supported Sitting balance-Leahy Scale: Good     Standing balance support: Bilateral upper extremity supported Standing balance-Leahy Scale:  Good Standing balance comment: relies on walker but overall does well                            Cognition Arousal/Alertness: Awake/alert Behavior During Therapy: WFL for tasks assessed/performed;Flat affect Overall Cognitive Status: Within Functional Limits for tasks assessed                                        Exercises Total Joint Exercises Goniometric ROM: 4-70 Other Exercises Other Exercises: Supine L LE therex, 1x10, act assist ROM: ankle pumps, quad sets, SAQs, heel slides, hip abduct/adduct, SLR.    General Comments        Pertinent Vitals/Pain Pain Assessment: Faces Faces Pain Scale: Hurts little more Pain Location: L knee Pain Descriptors / Indicators: Aching;Grimacing;Guarding Pain Intervention(s): Limited activity within patient's tolerance;Monitored during session;Premedicated before session;Repositioned;Ice applied    Home Living                      Prior Function            PT Goals (current goals can now be found in the care plan section) Progress towards PT goals: Progressing toward goals    Frequency    BID      PT Plan Current plan remains appropriate    Co-evaluation              AM-PAC PT "  6 Clicks" Mobility   Outcome Measure  Help needed turning from your back to your side while in a flat bed without using bedrails?: A Little Help needed moving from lying on your back to sitting on the side of a flat bed without using bedrails?: A Little Help needed moving to and from a bed to a chair (including a wheelchair)?: A Little Help needed standing up from a chair using your arms (e.g., wheelchair or bedside chair)?: A Little Help needed to walk in hospital room?: A Little Help needed climbing 3-5 steps with a railing? : A Little 6 Click Score: 18    End of Session Equipment Utilized During Treatment: Gait belt Activity Tolerance: Patient tolerated treatment well Patient left: in chair;with call  bell/phone within reach Nurse Communication: Mobility status PT Visit Diagnosis: Muscle weakness (generalized) (M62.81);Difficulty in walking, not elsewhere classified (R26.2);Pain Pain - Right/Left: Left Pain - part of body: Knee     Time: 1224-8250 PT Time Calculation (min) (ACUTE ONLY): 39 min  Charges:  $Gait Training: 8-22 mins $Therapeutic Exercise: 8-22 mins $Therapeutic Activity: 8-22 mins                    Chesley Noon, PTA 02/22/21, 10:06 AM

## 2021-02-22 NOTE — Discharge Summary (Signed)
Physician Discharge Summary  Patient ID: Dorothy Turner MRN: [id number] DOB/AGE: [birth year] 75 y.o.  Admit date: 02/21/2021 Discharge date: 02/22/2021  Admission Diagnoses:  Total knee replacement status, left [Z96.652] Status post total knee replacement using cement, left [Z96.652]  Discharge Diagnoses: Patient Active Problem List   Diagnosis Date Noted  . Total knee replacement status, left 02/21/2021  . Status post total knee replacement using cement, left 02/21/2021  . 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 Mixed incontinence 08/14/2019  . Anxiety 06/11/2019  . Elevated C-reactive protein 06/11/2019  . Exposure to mold 06/11/2019  . Vitamin B12 deficiency (non anemic) 06/11/2019  . Vitamin D deficiency 06/11/2019  . Aching pain 04/01/2019  . Headache 04/01/2019  . Pelvic floor dysfunction 04/01/2019  . Restless legs 04/01/2019  . Dizziness 03/04/2019  . Insomnia 03/04/2019  . Tinnitus 03/04/2019  . 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 Abdominal bloating 02/20/2018  . Dysuria 02/20/2018  . Uterine prolapse 02/20/2018  . Primary osteoarthritis of left knee 03/25/2017  . Heart palpitations 02/09/2016  . Gastro-esophageal reflux disease without esophagitis 04/18/2015  . Hyperlipidemia 04/18/2015  . Irritable bowel syndrome without diarrhea 04/18/2015  . Atrophic vaginitis 04/18/2015  . Malignant neoplasm of breast ([place]) 04/18/2015  . 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 ANA positive 03/28/2015  . Lichen 13/06/6577    Past Medical History:  Diagnosis Date  . Atrophic vaginitis   . Breast cancer ([place]) 2001   right breast ca with lumpectomy and rad tx.   . 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 GERD (gastroesophageal reflux disease)   . Headache    2-3x/week.  Stress  . Hemochromatosis   . Hyperlipidemia   . IBS (irritable bowel syndrome)   . Malignant neoplasm of breast ([place])   . Paget disease, extra-mammary   . Personal  history of radiation therapy 2001   BREAST CA  . Plantar fibromatosis   . Tinnitus   . Vertigo    several months ago     Transfusion: None.   Consultants (if any):   Discharged Condition: Improved  Hospital Course: Dorothy Turner is an 75 y.o. female who was admitted 02/21/2021 with a diagnosis of primary osteoarthritis of the left knee and went to the operating room on 02/21/2021 and underwent the above named procedures.    Surgeries: Procedure(s): TOTAL KNEE ARTHROPLASTY on 02/21/2021 Patient tolerated the surgery well. Taken to PACU where she was stabilized and then transferred to the orthopedic floor.  Started on Eliquis 2.5mg  every 12 hours. Foot pumps applied bilaterally at 80 mm. Heels elevated on bed with rolled towels. No evidence of DVT. Negative Homan. Physical therapy started on day #1 for gait training and transfer. OT started day #1 for ADL and assisted devices.  On POD0 patient with nausea and vomiting, treated with Zofran with improvement.  Patient's IV and foley were both removed on POD1.  Implants: Left TKA using all-cemented [employer] Vanguard system with a 65 mm PCR femur, a 71 mm tibial tray with a 10 mm anterior stabilized E-poly insert, and a 34 x 7.8 mm all-poly 3-pegged domed patella.  She was given perioperative antibiotics:  Anti-infectives (From admission, onward)   Start     Dose/Rate Route Frequency Ordered Stop   02/21/21 1900  valACYclovir (VALTREX) tablet 500 mg        500 mg Oral Daily 02/21/21 1627     02/21/21 1730  ceFAZolin (ANCEF) IVPB 2g/100 mL premix  Status:  Discontinued        2 g 200 mL/hr over 30 Minutes Intravenous Every 6 hours 02/21/21 1632 02/21/21 1805   02/21/21 1415  ceFAZolin (ANCEF) IVPB 2g/100 mL premix        2 g 200 mL/hr over 30 Minutes Intravenous Every 6 hours 02/21/21 1407 02/22/21 0222   02/21/21 0707  ceFAZolin (ANCEF) 2-4 GM/100ML-% IVPB       Note to Pharmacy: Lyman Bishop   : cabinet override      02/21/21  0707 02/22/21 0222   02/21/21 0600  ceFAZolin (ANCEF) IVPB 2g/100 mL premix        2 g 200 mL/hr over 30 Minutes Intravenous On call to O.R. 02/21/21 0205 02/21/21 7412    .  She was given sequential compression devices, early ambulation, and Eliquis for DVT prophylaxis.  She benefited maximally from the hospital stay and there were no complications.    Recent vital signs:  Vitals:   02/22/21 0516 02/22/21 0844  BP: 120/77 115/79  Pulse: 96 92  Resp: 17 18  Temp: 97.9 F (36.6 C) 98.1 F (36.7 C)  SpO2: 96% 98%    Recent laboratory studies:  Lab Results  Component Value Date   HGB 9.5 (L) 02/22/2021   HGB 11.7 (L) 01/13/2021   HGB 11.4 (L) 11/22/2020   Lab Results  Component Value Date   WBC 10.4 02/22/2021   PLT 302 02/22/2021   No results found for: INR Lab Results  Component Value Date   NA 137 02/22/2021   K 3.6 02/22/2021   CL 104 02/22/2021   CO2 24 02/22/2021   BUN 9 02/22/2021   CREATININE 0.69 02/22/2021   GLUCOSE 116 (H) 02/22/2021    Discharge Medications:   Allergies as of 02/22/2021      Reactions   Ciprofloxacin Other (See Comments)   Tendonitis   Diphenhydramine Hcl Other (See Comments)   Makes legs twitch   Meloxicam Other (See Comments)   Blood in stools      Medication List    STOP taking these medications   aspirin 325 MG tablet   ibuprofen 200 MG tablet Commonly known as: ADVIL     TAKE these medications   acetaminophen 500 MG tablet Commonly known as: TYLENOL Take 1,000 mg by mouth 2 (two) times daily.   apixaban 2.5 MG Tabs tablet Commonly known as: Eliquis Take 1 tablet (2.5 mg total) by mouth 2 (two) times daily.   ASHWAGANDHA PO Take 2 tablets by mouth daily. With 300 mg magnesium   calcium carbonate 500 MG chewable tablet Commonly known as: TUMS - dosed in mg elemental calcium Chew 1 tablet by mouth daily as needed for indigestion or heartburn.   Fish Oil 1000 MG Caps Take 1,000-2,000 mg by mouth daily.    HYDROcodone-acetaminophen 5-325 MG tablet Commonly known as: Norco Take 1-2 tablets by mouth every 6 (six) hours as needed for moderate pain or severe pain. MAXIMUM TOTAL ACETAMINOPHEN DOSE IS 4000 MG PER DAY   Magnesium 500 MG Caps Take 500 mg by mouth daily.   ondansetron 4 MG tablet Commonly known as: ZOFRAN Take 1 tablet (4 mg total) by mouth every 6 (six) hours as needed for nausea.   OVER THE COUNTER MEDICATION Place 1 application into the left eye at bedtime. Hylonight eye ointment 5 g   polyethylene glycol 17 g packet Commonly known as: MIRALAX / GLYCOLAX Take 17 g by mouth every other day.  REFRESH LIQUIGEL OP Place 1 drop into the left eye daily as needed (Dry eye).   traMADol 50 MG tablet Commonly known as: Ultram Take 1 tablet (50 mg total) by mouth every 6 (six) hours as needed (For breakthrough pain).   Turmeric 500 MG Caps Take 1 capsule by mouth daily.   valACYclovir 500 MG tablet Commonly known as: VALTREX Take 500 mg by mouth daily.   vitamin B-12 1000 MCG tablet Commonly known as: CYANOCOBALAMIN Take 1,000 mcg by mouth daily.   Vitamin D3 50 MCG (2000 UT) capsule Take 2,000 Units by mouth daily.   zolpidem 5 MG tablet Commonly known as: AMBIEN Take 5 mg by mouth 3 (three) times a week.            Durable Medical Equipment  (From admission, onward)         Start     Ordered   02/21/21 1628  DME Bedside commode  Once       Question:  Patient needs a bedside commode to treat with the following condition  Answer:  Status post total knee replacement using cement, left   02/21/21 1627   02/21/21 1628  DME 3 n 1  Once       Comments: Status post left total knee arthroplasty   02/21/21 1627   02/21/21 1628  DME Walker rolling  Once       Question Answer Comment  Walker: With 5 Inch Wheels   Patient needs a walker to treat with the following condition Status post total knee replacement using cement, left      02/21/21 1627          Diagnostic Studies: DG Knee Left Port  Result Date: 02/21/2021 CLINICAL DATA:  Status post total knee replacement EXAM: PORTABLE LEFT KNEE - 1-2 VIEW COMPARISON:  None. FINDINGS: Frontal and lateral views were obtained. Patient is status post total knee replacement with prosthetic components well-seated. No fracture or dislocation. No erosion. Air is seen within the knee joint. There are skin staples anteriorly. IMPRESSION: Status post total knee replacement with prosthetic components well-seated. No fracture or dislocation. Acute postoperative changes noted. Electronically Signed   By: Lowella Grip III M.D.   On: 02/21/2021 11:55   Disposition: Discharge disposition: 01-Home or Self Care        Follow-up Information    Poggi, Marshall Cork, MD Follow up.   Specialty: Orthopedic Surgery Contact information: [place] [address] [phone]              Signed: Judson Roch PA-C 02/22/2021, 1:14 PM

## 2021-02-23 NOTE — Anesthesia Postprocedure Evaluation (Signed)
Anesthesia Post Note  Patient: Dorothy Turner  Procedure(s) Performed: TOTAL KNEE ARTHROPLASTY (Left Knee)  Patient location during evaluation: PACU Anesthesia Type: Spinal Level of consciousness: awake and alert Pain management: pain level controlled Vital Signs Assessment: post-procedure vital signs reviewed and stable Respiratory status: spontaneous breathing, nonlabored ventilation, respiratory function stable and patient connected to nasal cannula oxygen Cardiovascular status: blood pressure returned to baseline and stable Postop Assessment: no apparent nausea or vomiting Anesthetic complications: no   No complications documented.   Last Vitals:  Vitals:   02/22/21 0516 02/22/21 0844  BP: 120/77 115/79  Pulse: 96 92  Resp: 17 18  Temp: 36.6 C 36.7 C  SpO2: 96% 98%    Last Pain:  Vitals:   02/22/21 0800  TempSrc:   PainSc: 8                  Dorothy Turner [address]

## 2021-03-17 ENCOUNTER — Other Ambulatory Visit: Payer: Self-pay

## 2021-03-17 ENCOUNTER — Ambulatory Visit (INDEPENDENT_AMBULATORY_CARE_PROVIDER_SITE_OTHER): Payer: Self-pay

## 2021-03-17 DIAGNOSIS — B351 Tinea unguium: Secondary | ICD-10-CM

## 2021-03-17 DIAGNOSIS — L603 Nail dystrophy: Secondary | ICD-10-CM

## 2021-03-17 NOTE — Progress Notes (Signed)
Patient presents today for the 5th laser treatment. Diagnosed with mycotic nail infection by Dr. Milinda Pointer.   Toenail most affected hallux left. The coloration is still unchanged, but she says the toe feels better.  All other systems are negative.  Nails were filed thin. Laser therapy was administered to 1st toenails left and patient tolerated the treatment well. All safety precautions were in place.    Follow up in 8 weeks for laser # 6.

## 2021-03-29 ENCOUNTER — Other Ambulatory Visit: Payer: Self-pay | Admitting: *Deleted

## 2021-03-29 DIAGNOSIS — Z1231 Encounter for screening mammogram for malignant neoplasm of breast: Secondary | ICD-10-CM

## 2021-04-27 ENCOUNTER — Other Ambulatory Visit: Payer: Self-pay

## 2021-04-27 ENCOUNTER — Ambulatory Visit
Admission: RE | Admit: 2021-04-27 | Discharge: 2021-04-27 | Disposition: A | Discharge status: Home / Self Care | Payer: Medicare Other | Source: Ambulatory Visit | Attending: *Deleted | Admitting: *Deleted

## 2021-04-27 DIAGNOSIS — Z1231 Encounter for screening mammogram for malignant neoplasm of breast: Secondary | ICD-10-CM | POA: Diagnosis present

## 2021-05-19 ENCOUNTER — Other Ambulatory Visit: Payer: Self-pay

## 2021-05-19 ENCOUNTER — Ambulatory Visit (INDEPENDENT_AMBULATORY_CARE_PROVIDER_SITE_OTHER): Payer: Medicare Other

## 2021-05-19 DIAGNOSIS — L603 Nail dystrophy: Secondary | ICD-10-CM

## 2021-05-19 DIAGNOSIS — B351 Tinea unguium: Secondary | ICD-10-CM

## 2021-05-19 NOTE — Progress Notes (Signed)
Patient presents today for the 6th laser treatment. Diagnosed with mycotic nail infection by Dr. Milinda Pointer.   Toenail most affected hallux left. The coloration is still unchanged, but she says the toe feels better.  All other systems are negative.  Nails were filed thin. Laser therapy was administered to 1st toenails left and patient tolerated the treatment well. All safety precautions were in place.    Follow up 3 months with Dr.Hyatt for nail evaluation

## 2021-06-05 ENCOUNTER — Other Ambulatory Visit: Payer: Self-pay

## 2021-06-05 ENCOUNTER — Ambulatory Visit (INDEPENDENT_AMBULATORY_CARE_PROVIDER_SITE_OTHER): Payer: Medicare Other | Admitting: Obstetrics and Gynecology

## 2021-06-05 ENCOUNTER — Encounter: Payer: Self-pay | Admitting: Obstetrics and Gynecology

## 2021-06-05 VITALS — BP 128/72 | Ht 63.0 in | Wt 150.6 lb

## 2021-06-05 DIAGNOSIS — M810 Age-related osteoporosis without current pathological fracture: Secondary | ICD-10-CM

## 2021-06-05 DIAGNOSIS — N8110 Cystocele, unspecified: Secondary | ICD-10-CM

## 2021-06-05 DIAGNOSIS — Z13820 Encounter for screening for osteoporosis: Secondary | ICD-10-CM | POA: Diagnosis not present

## 2021-06-05 NOTE — Progress Notes (Signed)
Gynecology Annual Exam  PCP: Fabian November., MD  Chief Complaint:  Chief Complaint  Patient presents with   Gynecologic Exam    History of Present Illness: Patient is a 75 y.o. G3P3003 presents for annual exam. The patient has no complaints today.   She reports that she had a hysterectomy, prolapse repair, and bladder sling about 1 year ago through a OB/GYN in [place].  Her doctor recently moved to [facility] and she is not able to follow-up with them.  She reports that she feels like her prolapse is recurring with the sensation of a bulge being present in the vagina afterwards.  She reports that after the surgery she had difficulty in with urination and had to be seen several times in the hospital.  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  This was performed by a gynecological oncologist through [facility].  She had a lot of vulvar itching associated with this condition.  She had trialed topical steroids as well as topical estrogen without improvement eventually a biopsy was performed which diagnosed's disease.  Unfortunately the patient's gynecological oncologist has also moved away although she has an appointment to follow-up with a new provider through [facility] in September.  LMP: No LMP recorded. Patient is postmenopausal. She denies postmenopausal bleeding or spotting  There is no notable family history of breast or ovarian cancer in her family.  The patient has regular exercise: health club and gardening 3-4 days a week.  Review of Systems: Review of Systems  Constitutional:  Negative for chills, fever, malaise/fatigue and weight loss.  HENT:  Negative for congestion, hearing loss and sinus pain.   Eyes:  Negative for blurred vision and double vision.  Respiratory:  Negative for cough, sputum production, shortness of breath and wheezing.   Cardiovascular:  Negative for chest  pain, palpitations, orthopnea and leg swelling.  Gastrointestinal:  Negative for abdominal pain, constipation, diarrhea, nausea and vomiting.  Genitourinary:  Negative for dysuria, flank pain, frequency, hematuria and urgency.  Musculoskeletal:  Negative for back pain, falls and joint pain.  Skin:  Negative for itching and rash.  Neurological:  Negative for dizziness and headaches.  Psychiatric/Behavioral:  Negative for depression, substance abuse and suicidal ideas. The patient is not nervous/anxious.    Past Medical History:  Past Medical History:  Diagnosis Date   Atrophic vaginitis    Breast cancer ([place]) 2001   right breast ca with lumpectomy and rad tx.    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  Stress   Hemochromatosis    Hyperlipidemia    IBS (irritable bowel syndrome)    Malignant neoplasm of breast ([place])    Paget disease, extra-mammary    Personal history of radiation therapy 2001   BREAST CA   Plantar fibromatosis    Tinnitus    Vertigo    several months ago    Past Surgical History:  Past Surgical History:  Procedure Laterality Date   ANTERIOR AND POSTERIOR VAGINAL REPAIR  09/14/2019   [facility]   BASAL CELL CARCINOMA EXCISION     BLADDER SUSPENSION  09/14/2019   [facility]   BREAST BIOPSY Left 2001   benign, marked placed   BREAST BIOPSY Right 2001   positive   BREAST LUMPECTOMY Right    2001 with  radiation   CATARACT EXTRACTION W/PHACO Left 03/15/2020   Procedure: CATARACT EXTRACTION PHACO AND INTRAOCULAR LENS PLACEMENT (IOC) LEFT 10.91 01:01.4;  Surgeon: Birder Robson, MD;  Location: [place];  Service: Ophthalmology;  Laterality: Left;   COLONOSCOPY WITH PROPOFOL N/A 06/10/2020   Procedure: COLONOSCOPY WITH PROPOFOL;  Surgeon: Lesly Rubenstein, MD;  Location: [facility];  Service: Endoscopy;  Laterality: N/A;   left eye     reconstructive from skin cancer   MASTECTOMY  Right 2001   partial   TOTAL KNEE ARTHROPLASTY Left 02/21/2021   Procedure: TOTAL KNEE ARTHROPLASTY;  Surgeon: Corky Mull, MD;  Location: [facility];  Service: Orthopedics;  Laterality: Left;   VAGINAL HYSTERECTOMY  09/14/2019   [facility]   VULVECTOMY PARTIAL  2019    Gynecologic History:  No LMP recorded. Patient is postmenopausal. Last mammogram: 2022 Results were: BI-RAD I  Obstetric History: [id number]  Family History:  Family History  Problem Relation Age of Onset   Breast cancer Cousin        maternal cousin   Heart disease Mother    Colon cancer Mother    Atrial fibrillation Mother    Hypertension Mother    COPD Father     Social History:  Social History   Socioeconomic History   Marital status: Married    Spouse name: Gwyndolyn Saxon   Number of children: 3   Years of education: Not on file   Highest education level: Not on file  Occupational History   Not on file  Tobacco Use   Smoking status: Former    Packs/day: 0.50    Years: 41.50    Pack years: 20.75    Types: Cigarettes    Quit date: 1995    Years since quitting: 27.5   Smokeless tobacco: Never  Vaping Use   Vaping Use: Never used  Substance and Sexual Activity   Alcohol use: Yes    Alcohol/week: 10.0 standard drinks    Types: 10 Glasses of wine per week    Comment: 1-2 glasses of wine per night   Drug use: Never   Sexual activity: Not on file  Other Topics Concern   Not on file  Social History Narrative   Not on file   Social Determinants of Health   Financial Resource Strain: Not on file  Food Insecurity: Not on file  Transportation Needs: Not on file  Physical Activity: Not on file  Stress: Not on file  Social Connections: Not on file  Intimate Partner Violence: Not on file    Allergies:  Allergies  Allergen Reactions   Ciprofloxacin Other (See Comments)    Tendonitis   Diphenhydramine Hcl Other (See Comments)    Makes legs twitch   Meloxicam Other (See Comments)    Blood in stools     Medications: Prior to Admission medications   Medication Sig Start Date End Date Taking? Authorizing Provider  acetaminophen (TYLENOL) 500 MG tablet Take 1,000 mg by mouth 2 (two) times daily. 08/27/19   [provider]  apixaban (ELIQUIS) 2.5 MG TABS tablet Take 1 tablet (2.5 mg total) by mouth 2 (two) times daily. 02/22/21   Poggi, Marshall Cork, MD  ASHWAGANDHA PO Take 2 tablets by mouth daily. With 300 mg magnesium    [provider]  calcium carbonate (TUMS - DOSED IN MG ELEMENTAL CALCIUM) 500 MG chewable tablet Chew 1 tablet by mouth daily as needed for indigestion or heartburn.    [provider]  Carboxymethylcellulose Sodium (  REFRESH LIQUIGEL OP) Place 1 drop into the left eye daily as needed (Dry eye).    [provider]  Cholecalciferol (VITAMIN D3) 2000 units capsule Take 2,000 Units by mouth daily.     [provider]  HYDROcodone-acetaminophen (NORCO) 5-325 MG tablet Take 1-2 tablets by mouth every 6 (six) hours as needed for moderate pain or severe pain. MAXIMUM TOTAL ACETAMINOPHEN DOSE IS 4000 MG PER DAY 02/21/21   Poggi, Marshall Cork, MD  Magnesium 500 MG CAPS Take 500 mg by mouth daily.    [provider]  Omega-3 Fatty Acids (FISH OIL) 1000 MG CAPS Take 1,000-2,000 mg by mouth daily.    [provider]  ondansetron (ZOFRAN) 4 MG tablet Take 1 tablet (4 mg total) by mouth every 6 (six) hours as needed for nausea. 02/22/21   Lattie Corns, PA-C  OVER THE COUNTER MEDICATION Place 1 application into the left eye at bedtime. Hylonight eye ointment 5 g    [provider]  polyethylene glycol (MIRALAX / GLYCOLAX) 17 g packet Take 17 g by mouth every other day.    [provider]  traMADol (ULTRAM) 50 MG tablet Take 1 tablet (50 mg total) by mouth every 6 (six) hours as needed (For breakthrough pain). 02/21/21 02/21/22  Poggi, Marshall Cork, MD  Turmeric 500 MG CAPS Take 1 capsule by mouth daily.    [provider]  valACYclovir (VALTREX) 500 MG tablet Take 500 mg by mouth daily. 12/23/20   [provider]  vitamin B-12 (CYANOCOBALAMIN) 1000 MCG tablet Take 1,000 mcg by mouth daily.    [provider]  zolpidem (AMBIEN) 5 MG tablet Take 5 mg by mouth 3 (three) times a week. 01/29/17   [provider]    Physical Exam Vitals: Blood pressure 128/72, height '5\' 3"'$  (1.6 m), weight 150 lb 9.6 oz (68.3 kg).  Physical Exam Constitutional:      Appearance: She is well-developed.  Genitourinary:     Genitourinary Comments: External: Normal appearing vulva, s/p vulvectomy. No lesions noted.  Speculum examination: Normal appearing cuff. No blood in the vaginal vault. No discharge.   With straining cystocele is noted to come past the level of the hymen by 2 cm. No rectocele noted.   Bimanual examination:  No adnexal masses. No adnexal tenderness. Pelvis not fixed.  HENT:     Head: Normocephalic and atraumatic.  Neck:     Thyroid: No thyromegaly.  Cardiovascular:     Rate and Rhythm: Normal rate and regular rhythm.     Heart sounds: Normal heart sounds.  Pulmonary:     Effort: Pulmonary effort is normal.     Breath sounds: Normal breath sounds.  Abdominal:     General: Bowel sounds are normal. There is no distension.     Palpations: Abdomen is soft. There is no mass.  Musculoskeletal:     Cervical back: Neck supple.  Neurological:     Mental Status: She is alert and oriented to person, place, and time.  Skin:    General: Skin is warm and dry.  Psychiatric:        Behavior: Behavior normal.        Thought Content: Thought content normal.        Judgment: Judgment normal.  Vitals reviewed.     Female chaperone present for pelvic and breast  portions of the physical exam  Assessment: 75 y.o. 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  Mammogram is up to date  2) STI screening was offered and declined  3) ASCCP guidelines and rational discussed.  Pap smears are able to be discontinued  4) Colonoscopy -- performed in 2021, 10 year follow up advised.   5) Routine healthcare maintenance including cholesterol, diabetes screening discussed managed by PCP  6) Osteoporosis screening - Dexa scan ordered  7) Prior prolapse surgery with recurrence of cystocele and concerns regarding abnormal urination- Will refer to Urogynecology for consultation  8) History of vulvar paget's disease- [facility] with [facility] in September. Yearly vulvar inspection important to continue. Low threshold for biopsy.  More than 45 minutes were spent face to face with the patient in the room, reviewing the medical record, labs and images, and coordinating care for the patient. The plan of management was discussed in detail and counseling was provided.   Adrian Prows MD, [facility], [facility] 06/05/2021 1:46 PM

## 2021-06-05 NOTE — Patient Instructions (Signed)
[employer] Recommended Dietary Allowances for Calcium and Vitamin D  Age (yr) Calcium Recommended Dietary Allowance (mg/day) Vitamin D Recommended Dietary Allowance (international units/day)  9-18 1,300 600  19-50 1,000 600  51-70 1,200 600  71 and older 1,200 800  Data from [employer]. Dietary reference intakes: calcium, vitamin D. [place], [place]: [employer]ies [employer]; 2011.   Exercising to Stay Healthy To become healthy and stay healthy, it is recommended that you do moderate-intensity and vigorous-intensity exercise. You can tell that you are exercising at a moderate intensity if your heart starts beating faster and you start breathing faster but can still hold a conversation. You can tell that you are exercising at a vigorous intensity if you are breathing much harder andfaster and cannot hold a conversation while exercising. Exercising regularly is important. It has many health benefits, such as: Improving overall fitness, flexibility, and endurance. Increasing bone density. Helping with weight control. Decreasing body fat. Increasing muscle strength. Reducing stress and tension. Improving overall health. How often should I exercise? Choose an activity that you enjoy, and set realistic goals. Your health careprovider can help you make an activity plan that works for you. Exercise regularly as told by your health care provider. This may include: Doing strength training two times a week, such as: Lifting weights. Using resistance bands. Push-ups. Sit-ups. Yoga. Doing a certain intensity of exercise for a given amount of time. Choose from these options: A total of 150 minutes of moderate-intensity exercise every week. A total of 75 minutes of vigorous-intensity exercise every week. A mix of moderate-intensity and vigorous-intensity exercise every week. Children, pregnant women, people who have not exercised regularly, people who are overweight, and  older adults may need to talk with a health care provider about what activities are safe to do. If you have a medical condition, be sureto talk with your health care provider before you start a new exercise program. What are some exercise ideas? Moderate-intensity exercise ideas include: Walking 1 mile (1.6 km) in about 15 minutes. Biking. Hiking. Golfing. Dancing. Water aerobics. Vigorous-intensity exercise ideas include: Walking 4.5 miles (7.2 km) or more in about 1 hour. Jogging or running 5 miles (8 km) in about 1 hour. Biking 10 miles (16.1 km) or more in about 1 hour. Lap swimming. Roller-skating or in-line skating. Cross-country skiing. Vigorous competitive sports, such as football, basketball, and soccer. Jumping rope. Aerobic dancing. What are some everyday activities that can help me to get exercise? Yard work, such as: Pushing a lawn mower. Raking and bagging leaves. Washing your car. Pushing a stroller. Shoveling snow. Gardening. Washing windows or floors. How can I be more active in my day-to-day activities? Use stairs instead of an elevator. Take a walk during your lunch break. If you drive, park your car farther away from your work or school. If you take public transportation, get off one stop early and walk the rest of the way. Stand up or walk around during all of your indoor phone calls. Get up, stretch, and walk around every 30 minutes throughout the day. Enjoy exercise with a friend. Support to continue exercising will help you keep a regular routine of activity. What guidelines can I follow while exercising? Before you start a new exercise program, talk with your health care provider. Do not exercise so much that you hurt yourself, feel dizzy, or get very short of breath. Wear comfortable clothes and wear shoes with good support. Drink plenty of water while you exercise to prevent   dehydration or heat stroke. Work out until your breathing and your  heartbeat get faster. Where to find more information [place] Department of Health and Human Services: www.hhs.gov Centers for Disease Control and Prevention (CDC): www.cdc.gov Summary Exercising regularly is important. It will improve your overall fitness, flexibility, and endurance. Regular exercise also will improve your overall health. It can help you control your weight, reduce stress, and improve your bone density. Do not exercise so much that you hurt yourself, feel dizzy, or get very short of breath. Before you start a new exercise program, talk with your health care provider. This information is not intended to replace advice given to you by your health care provider. Make sure you discuss any questions you have with your healthcare provider. Document Revised: 10/14/2020 Document Reviewed: 10/14/2020 Elsevier Patient Education  2022 [employer]. Budget-Friendly Healthy Eating There are many ways to save money at the grocery store and continue to eat healthy. You can be successful if you: Plan meals according to your budget. Make a grocery list and only purchase food according to your grocery list. Prepare food yourself at home. What are tips for following this plan? Reading food labels Compare food labels between brand name foods and the store brand. Often the nutritional value is the same, but the store brand is lower cost. Look for products that do not have added sugar, fat, or salt (sodium). These often cost the same but are healthier for you. Products may be labeled as: Sugar-free. Nonfat. Low-fat. Sodium-free. Low-sodium. Look for lean ground beef labeled as at least 92% lean and 8% fat. Shopping  Buy only the items on your grocery list and go only to the areas of the store that have the items on your list. Use coupons only for foods and brands you normally buy. Avoid buying items you wouldn't normally buy simply because they are on sale. Check online and in newspapers for  weekly deals. Buy healthy items from the bulk bins when available, such as herbs, spices, flour, pasta, nuts, and dried fruit. Buy fruits and vegetables that are in season. Prices are usually lower on in-season produce. Look at the unit price on the price tag. Use it to compare different brands and sizes to find out which item is the best deal. Choose healthy items that are often low-cost, such as carrots, potatoes, apples, bananas, and oranges. Dried or canned beans are a low-cost protein source. Buy in bulk and freeze extra food. Items you can buy in bulk include meats, fish, poultry, frozen fruits, and frozen vegetables. Avoid buying "ready-to-eat" foods, such as pre-cut fruits and vegetables and pre-made salads. If possible, shop around to discover where you can find the best prices. Consider other retailers such as dollar stores, larger wholesale stores, local fruit and vegetable stands, and farmers markets. Do not shop when you are hungry. If you shop while hungry, it may be hard to stick to your list and budget. Resist impulse buying. Use your grocery list as your official plan for the week. Buy a variety of vegetables and fruits by purchasing fresh, frozen, and canned items. Look at the top and bottom shelves for deals. Foods at eye level (eye level of an adult or child) are usually more expensive. Be efficient with your time when shopping. The more time you spend at the store, the more money you are likely to spend. To save money when choosing more expensive foods like meats and dairy: Choose cheaper cuts of meat, such as bone-in   chicken thighs and drumsticks instead of skinless and boneless chicken. When you are ready to prepare the chicken, you can remove the skin yourself to make it healthier. Choose lean meats like chicken or turkey instead of beef. Choose canned seafood, such as tuna, salmon, or sardines. Buy eggs as a low-cost source of protein. Buy dried beans and peas, such as  lentils, split peas, or kidney beans instead of meats. Dried beans and peas are a good alternative source of protein. Buy the larger tubs of yogurt instead of individual-sized containers. Choose water instead of sodas and other sweetened beverages. Avoid buying chips, cookies, and other "junk food." These items are usually expensive and not healthy.  Cooking Make extra food and freeze the extras in meal-sized containers or in individual portions for fast meals and snacks. Pre-cook on days when you have extra time to prepare meals in advance. You can keep these meals in the fridge or freezer and reheat for a quick meal. When you come home from the grocery store, wash, peel, and cut fruits and vegetables so they are ready to use and eat. This will help reduce food waste. Meal planning Do not eat out or get fast food. Prepare food at home. Make a grocery list and make sure to bring it with you to the store. If you have a smart phone, you could use your phone to create your shopping list. Plan meals and snacks according to a grocery list and budget you create. Use leftovers in your meal plan for the week. Look for recipes where you can cook once and make enough food for two meals. Prepare budget-friendly types of meals like stews, casseroles, and stir-fry dishes. Try some meatless meals or try "no cook" meals like salads. Make sure that half your plate is filled with fruits or vegetables. Choose from fresh, frozen, or canned fruits and vegetables. If eating canned, remember to rinse them before eating. This will remove any excess salt added for packaging. Summary Eating healthy on a budget is possible if you plan your meals according to your budget, purchase according to your budget and grocery list, and prepare food yourself. Tips for buying more food on a limited budget include buying generic brands, using coupons only for foods you normally buy, and buying healthy items from the bulk bins when  available. Tips for buying cheaper food to replace expensive food include choosing cheaper, lean cuts of meat, and buying dried beans and peas. This information is not intended to replace advice given to you by your health care provider. Make sure you discuss any questions you have with your healthcare provider. Document Revised: 08/11/2020 Document Reviewed: 08/11/2020 Elsevier Patient Education  2022 [employer]. Bone Health Bones protect organs, store calcium, anchor muscles, and support the whole body. Keeping your bones strong is important, especially as you get older. Youcan take actions to help keep your bones strong and healthy. Why is keeping my bones healthy important?  Keeping your bones healthy is important because your body constantly replaces bone cells. Cells get old, and new cells take their place. As we age, we lose bone cells because the body may not be able to make enough new cells to replace the old cells. The amount of bone cells and bone tissue you have is referred toas bone mass. The higher your bone mass, the stronger your bones. The aging process leads to an overall loss of bone mass in the body, which can increase the likelihood of: Joint pain   and stiffness. Broken bones. A condition in which the bones become weak and brittle (osteoporosis). A large decline in bone mass occurs in older adults. In women, it occurs aboutthe time of menopause. What actions can I take to keep my bones healthy? Good health habits are important for maintaining healthy bones. This includes eating nutritious foods and exercising regularly. To have healthy bones, you need to get enough of the right minerals and vitamins. Most nutrition experts recommend getting these nutrients from the foods that you eat. In some cases, taking supplements may also be recommended. Doing certain types of exercise isalso important for bone health. What are the nutritional recommendations for healthy bones?  Eating  a well-balanced diet with plenty of calcium and vitamin D will help to protect your bones. Nutritional recommendations vary from person to person. Ask your health care provider what is healthy for you. Here are some generalguidelines. Get enough calcium Calcium is the most important (essential) mineral for bone health. Most people can get enough calcium from their diet, but supplements may be recommended for people who are at risk for osteoporosis. Good sources of calcium include: Dairy products, such as low-fat or nonfat milk, cheese, and yogurt. Dark green leafy vegetables, such as bok choy and broccoli. Calcium-fortified foods, such as orange juice, cereal, bread, soy beverages, and tofu products. Nuts, such as almonds. Follow these recommended amounts for daily calcium intake: Children, age 1-3: 700 mg. Children, age 4-8: 1,000 mg. Children, age 9-13: 1,300 mg. Teens, age 14-18: 1,300 mg. Adults, age 19-50: 1,000 mg. Adults, age 51-70: Men: 1,000 mg. Women: 1,200 mg. Adults, age 71 or older: 1,200 mg. Pregnant and breastfeeding females: Teens: 1,300 mg. Adults: 1,000 mg. Get enough vitamin D Vitamin D is the most essential vitamin for bone health. It helps the body absorb calcium. Sunlight stimulates the skin to make vitamin D, so be sure to get enough sunlight. If you live in a cold climate or you do not get outside often, your health care provider may recommend that you take vitamin D supplements. Good sources of vitamin D in your diet include: Egg yolks. Saltwater fish. Milk and cereal fortified with vitamin D. Follow these recommended amounts for daily vitamin D intake: Children and teens, age 1-18: 600 international units. Adults, age 50 or younger: 400-800 international units. Adults, age 51 or older: 800-1,000 international units. Get other important nutrients Other nutrients that are important for bone health include: Phosphorus. This mineral is found in meat, poultry,  dairy foods, nuts, and legumes. The recommended daily intake for adult men and adult women is 700 mg. Magnesium. This mineral is found in seeds, nuts, dark green vegetables, and legumes. The recommended daily intake for adult men is 400-420 mg. For adult women, it is 310-320 mg. Vitamin K. This vitamin is found in green leafy vegetables. The recommended daily intake is 120 mg for adult men and 90 mg for adult women. What type of physical activity is best for building and maintaining healthybones? Weight-bearing and strength-building activities are important for building and maintaining healthy bones. Weight-bearing activities cause muscles and bones to work against gravity. Strength-building activities increase the strength of the muscles that support bones. Weight-bearing and muscle-building activities include: Walking and hiking. Jogging and running. Dancing. Gym exercises. Lifting weights. Tennis and racquetball. Climbing stairs. Aerobics. Adults should get at least 30 minutes of moderate physical activity on most days. Children should get at least 60 minutes of moderate physical activity onmost days. Ask your health care   provider what type of exercise is best for you. How can I find out if my bone mass is low? Bone mass can be measured with an X-ray test called a bone mineral density (BMD) test. This test is recommended for all women who are age 65 or older. It may also be recommended for: Men who are age 70 or older. People who are at risk for osteoporosis because of: Having bones that break easily. Having a long-term disease that weakens bones, such as kidney disease or rheumatoid arthritis. Having menopause earlier than normal. Taking medicine that weakens bones, such as steroids, thyroid hormones, or hormone treatment for breast cancer or prostate cancer. Smoking. Drinking three or more alcoholic drinks a day. If you find that you have a low bone mass, you may be able to  preventosteoporosis or further bone loss by changing your diet and lifestyle. Where can I find more information? For more information, check out the following websites: National Osteoporosis Foundation: www.nof.org/patients National Institutes of Health: www.bones.nih.gov International Osteoporosis Foundation: www.iofbonehealth.org Summary The aging process leads to an overall loss of bone mass in the body, which can increase the likelihood of broken bones and osteoporosis. Eating a well-balanced diet with plenty of calcium and vitamin D will help to protect your bones. Weight-bearing and strength-building activities are also important for building and maintaining strong bones. Bone mass can be measured with an X-ray test called a bone mineral density (BMD) test. This information is not intended to replace advice given to you by your health care provider. Make sure you discuss any questions you have with your healthcare provider. Document Revised: 11/25/2017 Document Reviewed: 11/25/2017 Elsevier Patient Education  2022 [employer] Inc.  

## 2021-06-06 ENCOUNTER — Encounter: Payer: Self-pay | Admitting: Obstetrics and Gynecology

## 2021-06-28 NOTE — Progress Notes (Signed)
[facility] New Patient Evaluation and Consultation  Referring Provider: Homero Fellers, * PCP: Fabian November., MD Date of Service: 06/29/2021  SUBJECTIVE Chief Complaint: New Patient (Initial Visit)- prolapse  History of Present Illness: Dorothy Turner is a 75 y.o. White or Caucasian female seen in consultation at the request of Dr. Gilman Schmidt for evaluation of prolapse.    Review of records from Dr [facility] significant for: Has a prior prolapse repair at [facility] but office moved and no longer able to follow up.  s/p TVH, removal of fimbriated end of the right tube, USLS, Anterior and Posterior Repair, TVT midurethral sling, cystoscopy with Dr Lelon Huh and myself at [facility] on 10/02/19.   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   Urinary Symptoms: Does not leak urine.   Day time voids 4-5.  Nocturia: 1 times per night to void. Voiding dysfunction: she does not empty her bladder well.  does not use a catheter to empty bladder.  When urinating, she feels a weak stream and the need to urinate multiple times in a row (occasionally)  UTIs:  0  UTI's in the last year.   Denies history of blood in urine and kidney or bladder stones  Pelvic Organ Prolapse Symptoms:                  She Admits to a feeling of a bulge the vaginal area. It has been present in the last year. Can see a bulge coming out of the vagina.  This bulge is bothersome.  Bowel Symptom: Bowel movements: 1 time(s) per day Stool consistency: soft  Straining: no.  Splinting: no.  Incomplete evacuation: no.  She Admits to accidental bowel leakage / fecal incontinence  Occurs: occsaionally, scant  Consistency with leakage: soft  Bowel regimen: miralax Last colonoscopy: Date 2021, Results negative  Sexual Function Sexually active: no.   Pelvic Pain Admits to pelvic pain Pain occurs: right groin  Improved by: urinating   Past Medical History:  Past  Medical History:  Diagnosis Date   Atrophic vaginitis    Breast cancer ([place]) 2001   right breast ca with lumpectomy and rad tx.    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  Stress   Hemochromatosis    Hyperlipidemia    IBS (irritable bowel syndrome)    Malignant neoplasm of breast ([place])    Paget disease, extra-mammary    Personal history of radiation therapy 2001   BREAST CA   Plantar fibromatosis    Tinnitus    Vertigo    several months ago     Past Surgical History:   Past Surgical History:  Procedure Laterality Date   ANTERIOR AND POSTERIOR VAGINAL REPAIR  09/14/2019   [facility]   BASAL CELL CARCINOMA EXCISION     BLADDER SUSPENSION  09/14/2019   [facility]   BREAST BIOPSY Left 2001   benign, marked placed   BREAST BIOPSY Right 2001   positive   BREAST LUMPECTOMY Right    2001 with radiation   CATARACT EXTRACTION W/PHACO Left 03/15/2020   Procedure: CATARACT EXTRACTION PHACO AND INTRAOCULAR LENS PLACEMENT ([place]) LEFT 10.91 01:01.4;  Surgeon: Birder Robson, MD;  Location: [facility];  Service: Ophthalmology;  Laterality: Left;   COLONOSCOPY WITH PROPOFOL N/A 06/10/2020   Procedure: COLONOSCOPY WITH PROPOFOL;  Surgeon: Lesly Rubenstein, MD;  Location: [facility];  Service: Endoscopy;  Laterality: N/A;   left eye     reconstructive from skin cancer   MASTECTOMY Right 2001   partial   TOTAL KNEE ARTHROPLASTY Left 02/21/2021   Procedure: TOTAL KNEE ARTHROPLASTY;  Surgeon: Corky Mull, MD;  Location: [facility];  Service: Orthopedics;  Laterality: Left;   VAGINAL HYSTERECTOMY  09/14/2019   [facility]   VULVECTOMY PARTIAL  2019     Past OB/GYN History: OB History  Gravida Para Term Preterm AB Living  '3 3 3     3  '$ SAB IAB Ectopic Multiple Live Births          3    # Outcome Date GA Lbr Len/2nd Weight Sex Delivery Anes PTL Lv  3 Term 12/18/77    F Vag-Spont  Y LIV  2 Term 02/13/72    M  Vag-Spont   LIV  1 Term 01/24/72    M Vag-Forceps   LIV   S/p hysterectomy   Medications: She has a current medication list which includes the following prescription(s): acetaminophen, ashwagandha, calcium carbonate, carboxymethylcellulose sodium, vitamin d3, magnesium, fish oil, OVER THE COUNTER MEDICATION, polyethylene glycol, turmeric, valacyclovir, vitamin b-12, and zolpidem.   Allergies: Patient is allergic to ciprofloxacin, diphenhydramine hcl, and meloxicam.   Social History:  Social History   Tobacco Use   Smoking status: Former    Packs/day: 0.50    Years: 41.50    Pack years: 20.75    Types: Cigarettes    Quit date: 1995    Years since quitting: 27.6   Smokeless tobacco: Never  Vaping Use   Vaping Use: Never used  Substance Use Topics   Alcohol use: Yes    Alcohol/week: 10.0 standard drinks    Types: 10 Glasses of wine per week    Comment: 1-2 glasses of wine per night   Drug use: Never    Relationship status: married She lives with husband.   She is not employed. Regular exercise: Yes: goes to health club 3x week and gardening History of abuse: No  Family History:   Family History  Problem Relation Age of Onset   Breast cancer Cousin        maternal cousin   Heart disease Mother    Colon cancer Mother    Atrial fibrillation Mother    Hypertension Mother    COPD Father      Review of Systems: Review of Systems  Constitutional:  Negative for fever, malaise/fatigue and weight loss.  Respiratory:  Negative for cough, shortness of breath and wheezing.   Cardiovascular:  Negative for chest pain, palpitations and leg swelling.  Gastrointestinal:  Negative for abdominal pain and blood in stool.  Genitourinary:  Negative for dysuria.  Musculoskeletal:  Positive for myalgias.  Skin:  Negative for rash.  Neurological:  Positive for headaches. Negative for dizziness.  Endo/Heme/Allergies:  Bruises/bleeds easily.  Psychiatric/Behavioral:  Negative for  depression. The patient is not nervous/anxious.     OBJECTIVE Physical Exam: Vitals:   06/29/21 0953  BP: 140/84  Pulse: 61  Weight: 150 lb (68 kg)  Height: '5\' 3"'$  (1.6 m)    Physical Exam Constitutional:      General: She is not in acute distress. Pulmonary:     Effort: Pulmonary effort is normal.  Abdominal:     General: There is no distension.     Palpations: Abdomen is soft.     Tenderness: There is no abdominal tenderness. There is no rebound.  Musculoskeletal:        General: No swelling. Normal range of motion.  Skin:    General: Skin is warm and dry.     Findings: No rash.  Neurological:     Mental Status: She is alert and oriented to person, place, and time.  Psychiatric:        Mood and Affect: Mood normal.        Behavior: Behavior normal.     GU / Detailed Urogynecologic Evaluation:  Pelvic Exam: Normal external female genitalia; Bartholin's and Skene's glands normal in appearance; urethral meatus normal in appearance, no urethral masses or discharge.   CST: negative  s/p hysterectomy: Speculum exam reveals normal vaginal mucosa with  atrophy and normal vaginal cuff.  Adnexa no mass, fullness, tenderness.     Pelvic floor strength II/V, puborectalis IV/V external anal sphincter III/V  Pelvic floor musculature: Right levator tender, Right obturator tender, Left levator non-tender, Left obturator tender  POP-Q:   POP-Q  0                                            Aa   0                                           Ba  -5                                              C   4                                            Gh  3                                            Pb  7                                            tvl   -2                                            Ap  -2                                            Bp                                                 D     Rectal Exam:  Normal sphincter tone,  small distal rectocele,  enterocoele not present, no rectal masses, no sign of dyssynergia when asking the patient to bear down.  Post-Void Residual (PVR) by Bladder Scan: In order to evaluate bladder emptying, we discussed obtaining a postvoid residual and she agreed to this procedure.  Procedure: The ultrasound unit was placed on the patient's abdomen in the suprapubic region after the patient had voided. A PVR of 85 ml was obtained by bladder scan.  Laboratory Results: POC urine: negative   ASSESSMENT AND PLAN Ms. Dorothy Turner is a 75 y.o. with:  1. Prolapse of anterior vaginal wall   2. Vaginal vault prolapse after hysterectomy   3. Levator spasm   4. Urinary urgency     Stage II anterior, Stage I posterior, Stage I apical prolapse For treatment of pelvic organ prolapse, we discussed options for management including expectant management, conservative management, and surgical management, such as Kegels, a pessary, pelvic floor physical therapy, and specific surgical procedures. - She is not interested in surgery at this time. Has tried a pessary in the past. She would like to start with pelvic floor physical therapy, referral placed to [facility] rehab.   2. Levator spasm - likely the cause of her pelvic discomfort and urinary hesitancy. She will work with pelvic floor PT to help relax pelvic floor.   3. Urinary urgency - rare, not bothersome  Dorothy Folds, MD   Medical Decision Making:  - Reviewed/ ordered a clinical laboratory test - Review and summation of prior records

## 2021-06-29 ENCOUNTER — Encounter: Payer: Self-pay | Admitting: Obstetrics and Gynecology

## 2021-06-29 ENCOUNTER — Ambulatory Visit (INDEPENDENT_AMBULATORY_CARE_PROVIDER_SITE_OTHER): Payer: Medicare Other | Admitting: Obstetrics and Gynecology

## 2021-06-29 ENCOUNTER — Other Ambulatory Visit: Payer: Self-pay

## 2021-06-29 VITALS — BP 140/84 | HR 61 | Ht 63.0 in | Wt 150.0 lb

## 2021-06-29 DIAGNOSIS — R3915 Urgency of urination: Secondary | ICD-10-CM | POA: Diagnosis not present

## 2021-06-29 DIAGNOSIS — N8110 Cystocele, unspecified: Secondary | ICD-10-CM | POA: Diagnosis not present

## 2021-06-29 DIAGNOSIS — N993 Prolapse of vaginal vault after hysterectomy: Secondary | ICD-10-CM

## 2021-06-29 DIAGNOSIS — M62838 Other muscle spasm: Secondary | ICD-10-CM | POA: Diagnosis not present

## 2021-06-29 LAB — POCT URINALYSIS DIPSTICK
Appearance: NORMAL
Bilirubin, UA: NEGATIVE
Blood, UA: NEGATIVE
Glucose, UA: NEGATIVE
Ketones, UA: NEGATIVE
Leukocytes, UA: NEGATIVE
Nitrite, UA: NEGATIVE
Protein, UA: NEGATIVE
Spec Grav, UA: 1.01
Urobilinogen, UA: 0.2 U/dL
pH, UA: 6

## 2021-06-29 NOTE — Patient Instructions (Addendum)
Accidental Bowel Leakage: Our goal is to achieve formed bowel movements daily or every-other-day without leakage.  You may need to try different combinations of the following options to find what works best for you.  Some management options include: Dietary changes (more leafy greens, vegetables and fruits; less processed foods) Fiber supplementation (Metamucil or something with psyllium as active ingredient) Over-the-counter imodium (tablets or liquid) to help solidify the stool and prevent leakage of stool.   You have a stage 2 (out of 4) prolapse.  We discussed the fact that it is not life threatening but there are several treatment options. For treatment of pelvic organ prolapse, we discussed options for management including expectant management, conservative management, and surgical management, such as Kegels, a pessary, pelvic floor physical therapy, and specific surgical procedures.    

## 2021-06-30 IMAGING — MG DIGITAL DIAGNOSTIC BILAT W/ TOMO W/ CAD
6 series · 6 of 18 positions shown · non-contrast
Comparison: Previous exams.

CLINICAL DATA: Follow-up for probably benign left breast
calcifications.

EXAM:
DIGITAL DIAGNOSTIC BILATERAL MAMMOGRAM WITH TOMO AND CAD

[L CC]
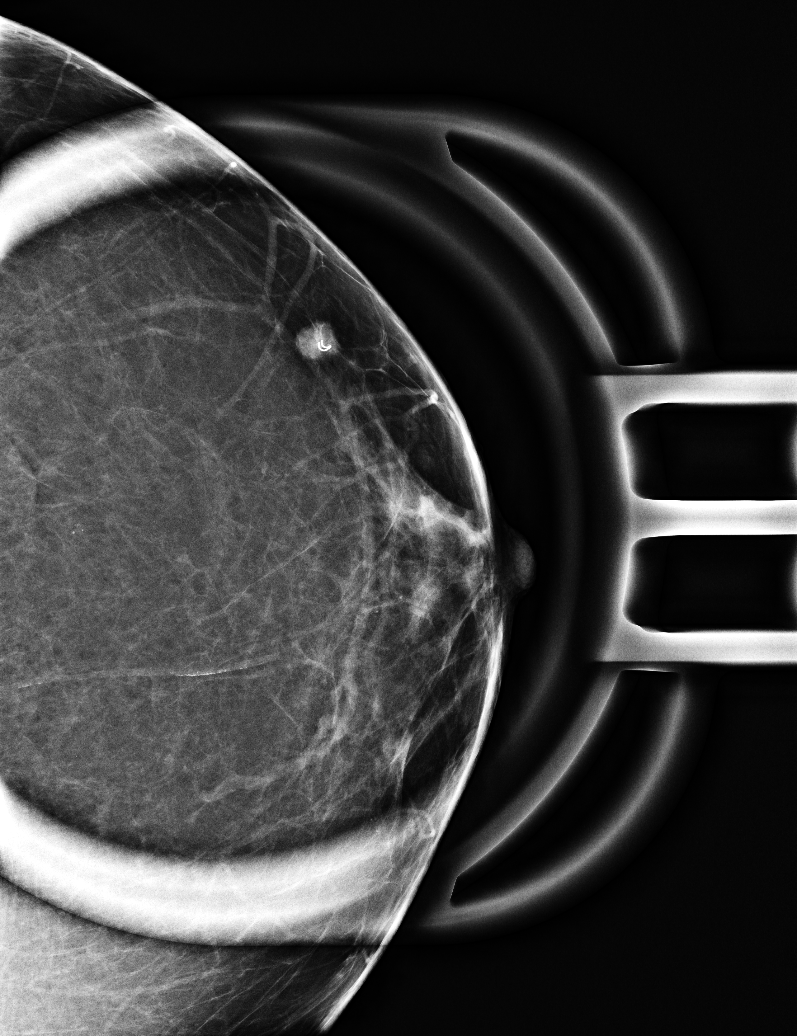

[L CC synth-2D]
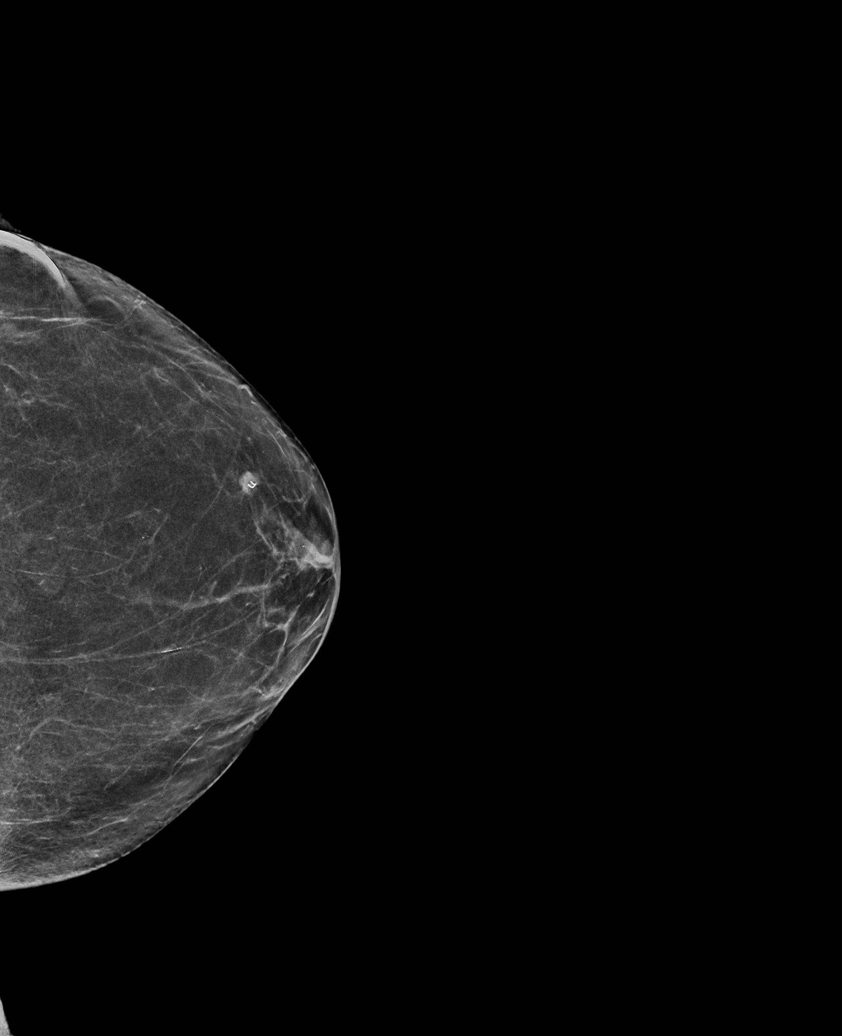

[R CC synth-2D]
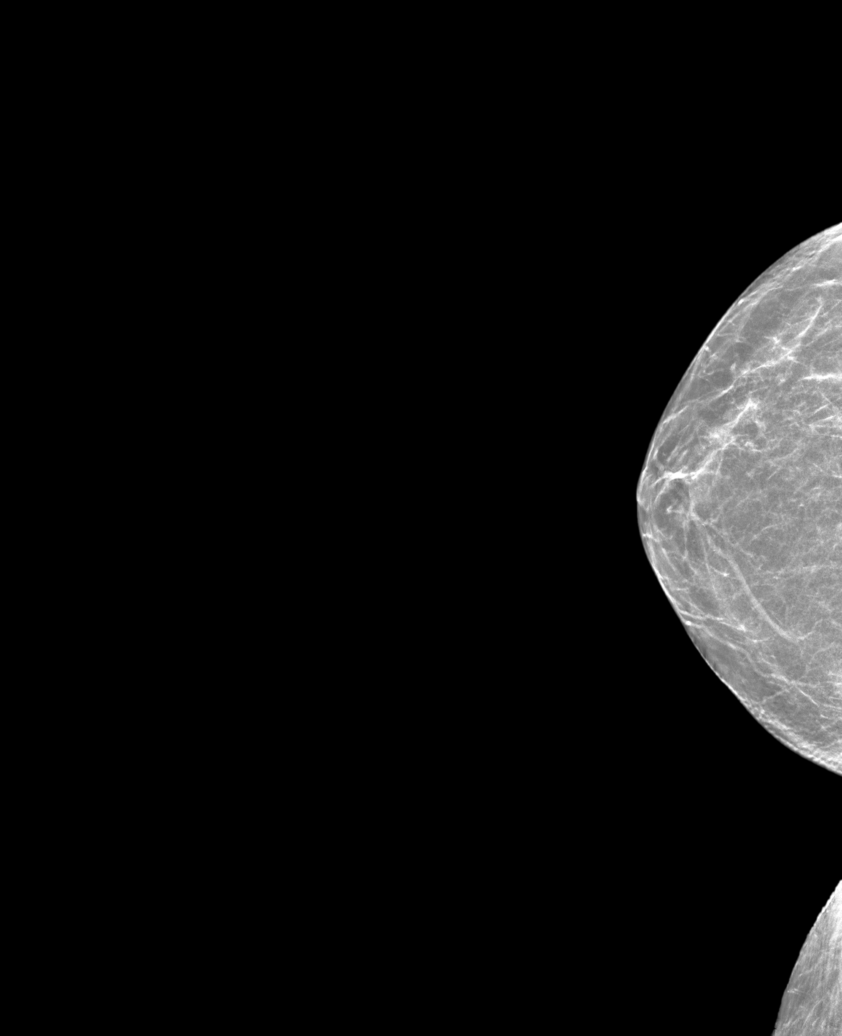

[R MLO tomo · tomo slice 27/54.0]
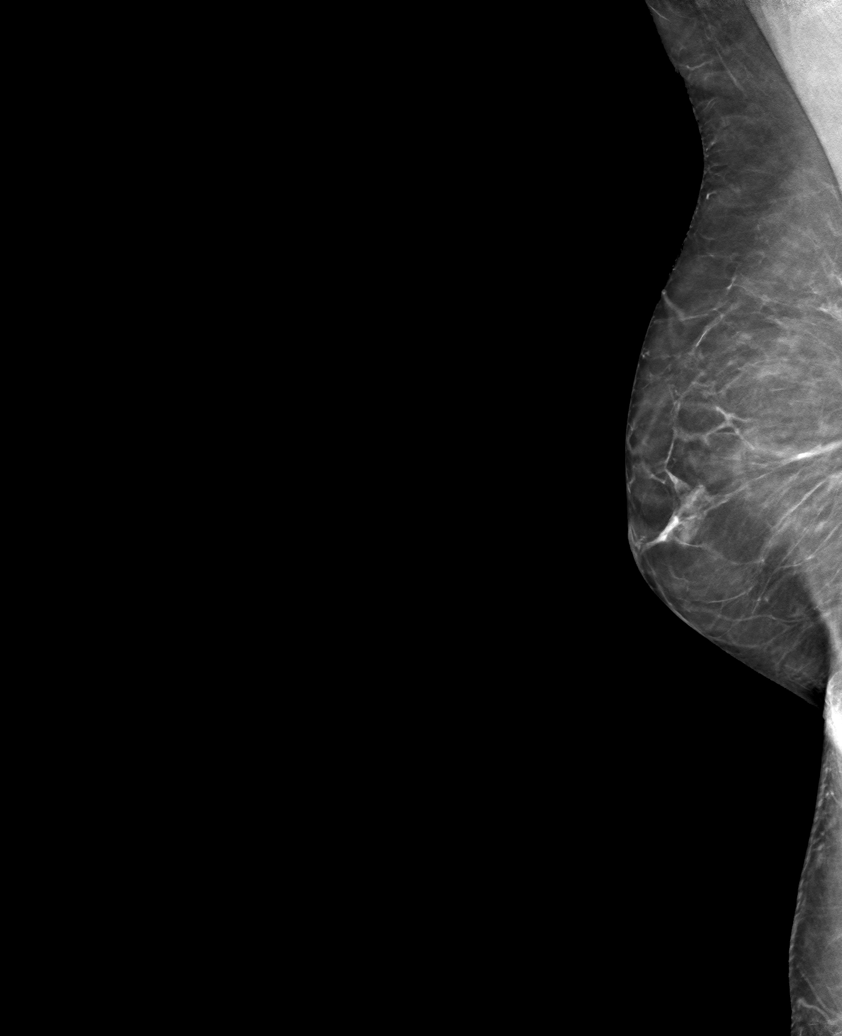

[L CC tomo · tomo slice 29/56.0]
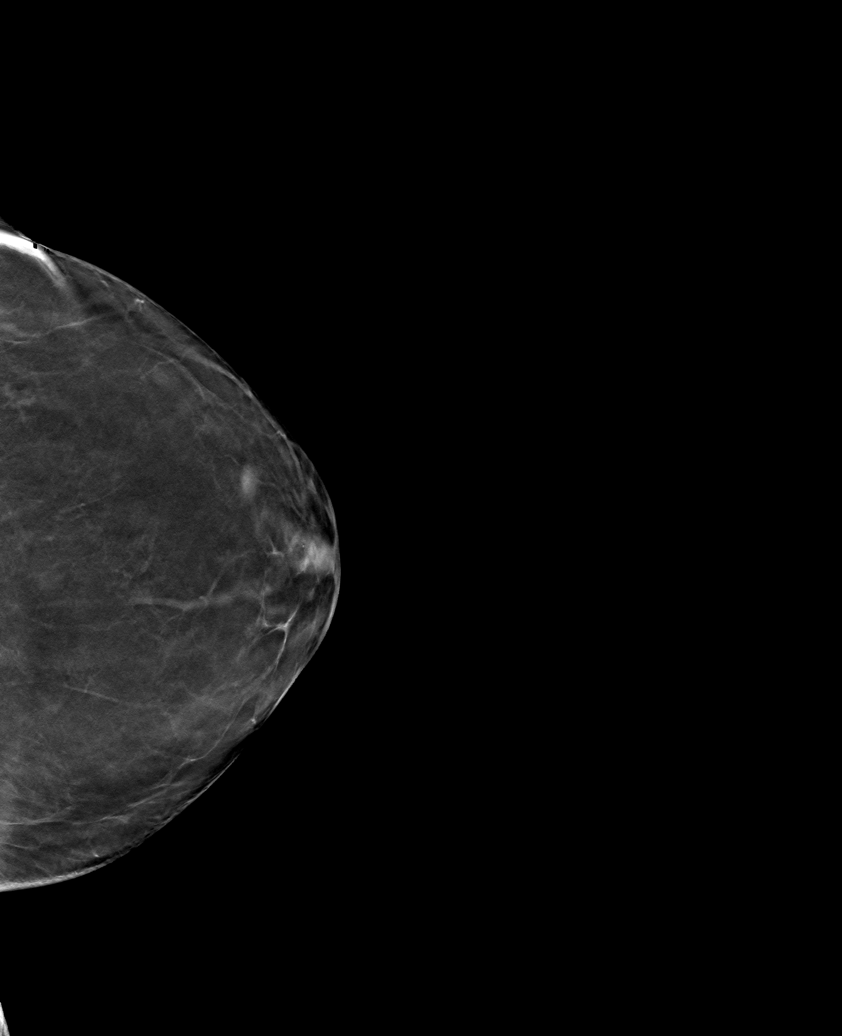

[L MLO tomo · tomo slice 29/57.0]
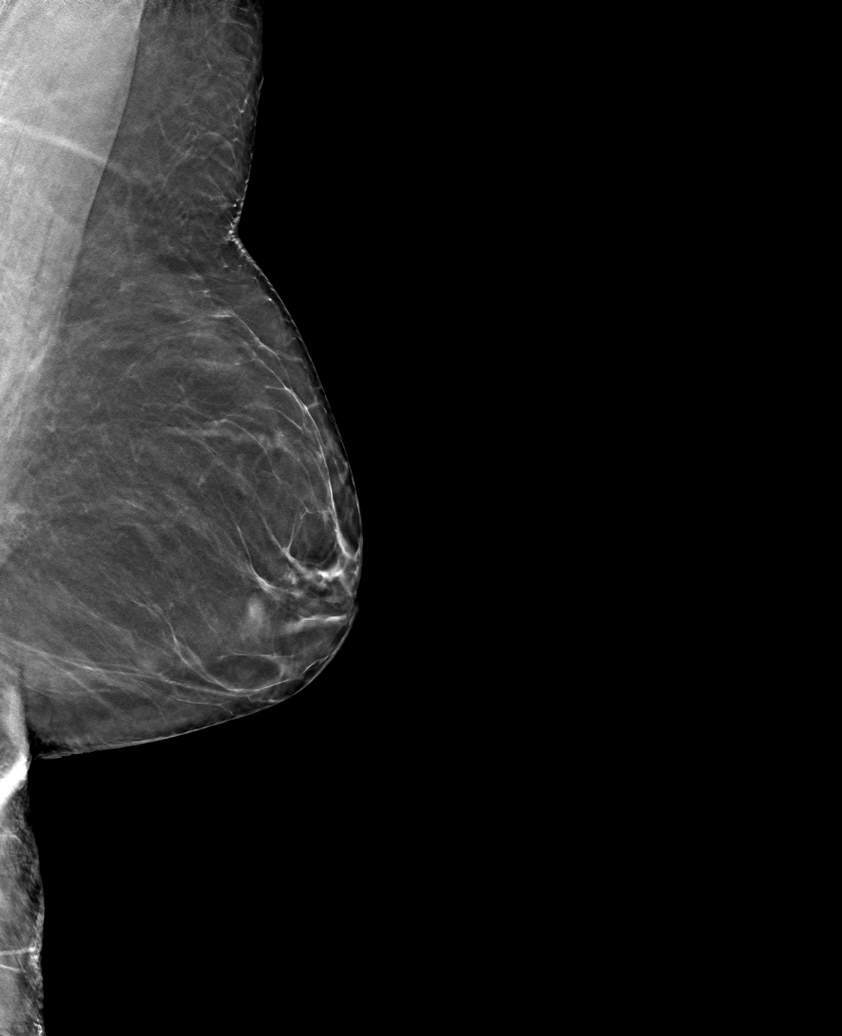

[6 of 18 positions shown; findings below may reference images not displayed]

ACR Breast Density Category b: There are scattered areas of
fibroglandular density.
FINDINGS: No suspicious masses or calcifications are seen in either breast.
Spot compression magnification views were performed over the central
left breast. The 2 groups of punctate calcifications are unchanged
and given 2 years of stability considered benign. There is no
mammographic evidence of malignancy in either breast.

Mammographic images were processed with CAD.
IMPRESSION: No mammographic evidence of malignancy in either breast.

RECOMMENDATION:
Screening mammogram in one year.(Code:[id number])

I have discussed the findings and recommendations with the patient.
If applicable, a reminder letter will be sent to the patient
regarding the next appointment.

BI-RADS CATEGORY  2: Benign.

## 2021-07-25 ENCOUNTER — Encounter: Payer: Self-pay | Admitting: Physical Therapy

## 2021-07-25 ENCOUNTER — Ambulatory Visit: Payer: Medicare Other | Attending: Obstetrics and Gynecology | Admitting: Physical Therapy

## 2021-07-25 ENCOUNTER — Other Ambulatory Visit: Payer: Self-pay

## 2021-07-25 DIAGNOSIS — R2689 Other abnormalities of gait and mobility: Secondary | ICD-10-CM | POA: Insufficient documentation

## 2021-07-25 DIAGNOSIS — G8929 Other chronic pain: Secondary | ICD-10-CM | POA: Diagnosis present

## 2021-07-25 DIAGNOSIS — M533 Sacrococcygeal disorders, not elsewhere classified: Secondary | ICD-10-CM | POA: Diagnosis not present

## 2021-07-25 DIAGNOSIS — M25551 Pain in right hip: Secondary | ICD-10-CM | POA: Insufficient documentation

## 2021-07-25 DIAGNOSIS — M545 Low back pain, unspecified: Secondary | ICD-10-CM | POA: Diagnosis present

## 2021-07-25 DIAGNOSIS — M25651 Stiffness of right hip, not elsewhere classified: Secondary | ICD-10-CM | POA: Diagnosis present

## 2021-07-25 NOTE — Patient Instructions (Signed)
  Avoid straining pelvic floor, abdominal muscles , spine  Use log rolling technique instead of getting out of bed with your neck or the sit-up     Log rolling into and out of bed   Log rolling into and out of bed If getting out of bed on R side, Bent knees, scoot hips/ shoulder to L  Raise R arm completely overhead, rolling onto armpit  Then lower bent knees to bed to get into complete side lying position  Then drop legs off bed, and push up onto R elbow/forearm, and use L hand to push onto the bed  __   Proper body mechanics with getting out of a chair to decrease strain  on back &pelvic floor   Avoid holding your breath when Getting out of the chair:  Scoot to front part of chair chair Heels behind knees, feet are hip width apart, nose over toes  Inhale like you are smelling roses Exhale to stand   __  Sitting with uncrossed ankles, thighs, plant feet evenly on ground

## 2021-07-26 NOTE — Therapy (Signed)
[place] MAIN [facility] SERVICES [address] [place], [place], [address] Phone: [phone]   Fax:  [phone]  Physical Therapy Evaluation  Patient Details  Name: Dorothy Turner MRN: [id number] Date of Birth: 04/30/1946 Referring Provider (PT): Wannetta Sender   Encounter Date: 07/25/2021   PT End of Session - 07/25/21 1614     Visit Number 1    Number of Visits 10    Date for PT Re-Evaluation 10/03/21    PT Start Time [id number]    PT Stop Time 1700    PT Time Calculation (min) 53 min    Activity Tolerance Patient tolerated treatment well    Behavior During Therapy [facility] for tasks assessed/performed             Past Medical History:  Diagnosis Date   Atrophic vaginitis    Breast cancer ([place]) 2001   right breast ca with lumpectomy and rad tx.    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  Stress   Hemochromatosis    Hyperlipidemia    IBS (irritable bowel syndrome)    Malignant neoplasm of breast ([place])    Paget disease, extra-mammary    Personal history of radiation therapy 2001   BREAST CA   Plantar fibromatosis    Tinnitus    Vertigo    several months ago    Past Surgical History:  Procedure Laterality Date   ANTERIOR AND POSTERIOR VAGINAL REPAIR  09/14/2019   [facility]   BASAL CELL CARCINOMA EXCISION     BLADDER SUSPENSION  09/14/2019   [facility]   BREAST BIOPSY Left 2001   benign, marked placed   BREAST BIOPSY Right 2001   positive   BREAST LUMPECTOMY Right    2001 with radiation   CATARACT EXTRACTION W/PHACO Left 03/15/2020   Procedure: CATARACT EXTRACTION PHACO AND INTRAOCULAR LENS PLACEMENT ([place]) LEFT 10.91 01:01.4;  Surgeon: Birder Robson, MD;  Location: [place];  Service: Ophthalmology;  Laterality: Left;   COLONOSCOPY WITH PROPOFOL N/A 06/10/2020   Procedure: COLONOSCOPY WITH PROPOFOL;  Surgeon: Lesly Rubenstein, MD;  Location: [facility]  ENDOSCOPY;  Service: Endoscopy;  Laterality: N/A;   left eye     reconstructive from skin cancer   MASTECTOMY Right 2001   partial   TOTAL KNEE ARTHROPLASTY Left 02/21/2021   Procedure: TOTAL KNEE ARTHROPLASTY;  Surgeon: Corky Mull, MD;  Location: [facility] ORS;  Service: Orthopedics;  Laterality: Left;   VAGINAL HYSTERECTOMY  09/14/2019   [facility]   VULVECTOMY PARTIAL  2019    There were no vitals filed for this visit.    Subjective Assessment - 07/25/21 1609     Subjective 1) Pt experience prolapse all the time and worsens at the end of the day . Pt can see a bulge out of vagina. Pt declined pessary because of discomfort. Pt does not have leakage only urgency.  Pt is not sure if she is emptying her bladder completely.  Pt has to return to the bathroom after going.  Bowel movements are regular and takes Miralex daily. Pt does not have to strain. Nocturia  - once a night but not every night. Physical fitness: goes to the gym 3 x week with use of weight machines. Started 2 months ago. Pt gardens alot and lifts, pulling, pushing,    2)  R hip pain 8/10 occur with long periods of sitting  and then getting up , lying on R side with radiating pain down to knee.  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 L TKA 2022. Gynceology Hx: episiotomy with 3 vaginal deliveries.    How long can you sit comfortably? --    How long can you stand comfortably? --    How long can you walk comfortably? --    Diagnostic tests --    Patient Stated Goals get in shape to avoid surgery    Pain Onset --                [facility] PT Assessment - 07/26/21 1748       Assessment   Medical Diagnosis prolapse    Referring Provider (PT) Wannetta Sender      Precautions   Precautions None      Restrictions    Weight Bearing Restrictions No      Observation/Other Assessments   Scoliosis L shoulder, hip higher, R  thoracic, L lumbar convex curve      Strength   Overall Strength Comments R hip flex 3/5, L 4+/5, B knee flexion/ ext 4-/5, B hip abd 3-/5,      Palpation   Spinal mobility L rotation. sideflexion limited compared to R    SI assessment  supine: levelled ASIS/ malleoli B    Palpation comment plan to assess R lumpectomy scars,      Bed Mobility   Bed Mobility --   crunch method     Ambulation/Gait   Gait velocity 1.04 m/s    Gait Comments L genu valgus, limited thoracic rotation, decreased R stance phase '                        Objective measurements completed on examination: See above findings.     Pelvic Floor Special Questions - 07/25/21 1648     Diastasis Recti neg              [facility] Adult PT Treatment/Exercise - 07/26/21 1747       Bed Mobility   Bed Mobility --   crunch method     Ambulation/Gait   Gait velocity 1.04 m/s    Gait Comments L genu valgus, limited thoracic rotation, decreased R stance phase '      Therapeutic Activites    Other Therapeutic Activities Explained POC, anatomy/ physiology explained,  etiology of Sx      Neuro Re-ed    Neuro Re-ed Details  cued for body mechancis to minimzie straining pelvic organs and floor mm                       PT Long Term Goals - 07/25/21 1628       PT LONG TERM GOAL #1   Title Pt will demo proper body mechanics for housecleaning, gardening, fitness routine  to minimize straining/ worsening of pelvic floor adysfunctions and prolapse.    Time 4    Period Weeks    Status New    Target Date 08/22/21      PT LONG TERM GOAL #2   Title Pt will report complete emptying of bladder and not need to return a second time to the toilet across 1 week in order edemo improved pelvic floor  coordination and strengthening    Time 6    Period Weeks    Status New    Target Date 09/05/21       PT LONG TERM GOAL #3   Title Pt will demo improved scores of FOTO Urinary from 61 pts to > 71 pts, Prolapse from 8 pts to < 4 pts  in order to improve incontinence and QOL    Time 10    Period Weeks    Status New    Target Date 10/03/21      PT LONG TERM GOAL #4   Title Pt will report being able to sit for a 30 min and then not need to wait to start walking and also decreased pain of 50% with lying on R side to demo improved R hip pain and return to ADLs    Baseline not able to    Time 8    Period Weeks    Status New    Target Date 09/19/21      PT LONG TERM GOAL #5   Title Pt will demo levelled pelvic girdle and shoulder across 2 weeks in order to progress to deep core strengthening and pelvic floor coordination    Baseline L shoulder, hip higher, R  thoracic, L lumbar convex curve    Time 2    Period Weeks    Target Date 08/09/21      Additional Long Term Goals   Additional Long Term Goals Yes      PT LONG TERM GOAL #6   Title Pt will demo increased gait speed from 1.04 m/s to > 1.10 m/s and less genu valgus on L  and report decreased R hip pain to improve aerobic health    Time 8    Period Weeks    Status New    Target Date 09/20/21      PT LONG TERM GOAL #7   Title Pt will demo decreased scar adhesions from Lumpectomy, partial mastectomy on R with completion of tomaxifen and radiation 2001, bladder sling procedure with hysterectomy 2020, Vulvectomy partialy L in order to optimize mobility    Time 8    Period Weeks    Status New    Target Date 09/20/21      PT LONG TERM GOAL #8   Title Pt will report no more radiating pain from R hip to knee when navigating stairs with single UE support    Time 8    Period Weeks    Status New    Target Date 09/20/21      PT LONG TERM GOAL  #9   TITLE Pt will demo proper technique and alignment with fitness and gym routine to mimimize worsening of prolapse    Time 6    Period Weeks    Status New    Target Date 09/06/21                     Plan - 07/25/21 1613     Clinical Impression Statement  Pt is a  75  yo  who presents with prolapse related Sx and R hip pain which impact her ADLs and QOL.   Pt's musculoskeletal assessment revealed  spinal curves , uneven shoulder and pelvic girdle height, gait deviations,   limited spinal /pelvic mobility, dyscoordination and strength of pelvic floor mm, and poor body mechanics which places strain on the abdominal/pelvic floor mm.   These are deficits that indicate an ineffective intraabdominal  pressure system associated with increased risk for pt's Sx.    Pt will benefit from coordination training and education on fitness and functional positions in order to gain a more effective intraabdominal pressure system to minimize Sx.  Pt was provided education on etiology of Sx with anatomy, physiology explanation with images along with the benefits of customized pelvic PT Tx based on pt's medical conditions and musculoskeletal deficits.  Explained the physiology of deep core mm coordination and roles of pelvic floor function in urination, defecation, sexual function, and postural control with deep core mm system.    Regional interdependent approaches will yield greater benefits in pt's POC due to the complexity of pt's medical Hx and the significant impact their Sx have had on their QOL. Pt would benefit from a biopsychosocial approach to yield optimal outcomes. Plan to build interdisciplinary team with pt's providers to optimize patient-centered care.   Pt benefits from skilled PT.    Personal Factors and Comorbidities Age;Comorbidity 1;Comorbidity 2;Fitness;Past/Current Experience;Sex    Comorbidities 09/14/2019  Anterior and posterior vaginal repair   09/14/2019  Bladder suspension   09/14/2019  Vaginal hysterectomy   2019  Vulvectomy partial  2001  Breast biopsy (Left)   2001  Breast biopsy (Right)   2001 lumpectomy  (Right)   Date Unknown  Basal cell carcinoma  excision  Date Unknown  Breast lumpectomy (Right)    Examination-Activity Limitations Continence;Other    Examination-Participation Restrictions --    Stability/Clinical Decision Making Evolving/Moderate complexity    Clinical Decision Making Moderate    Rehab Potential Good    PT Frequency 1x / week    PT Duration Other (comment)   10   PT Treatment/Interventions ADLs/Self Care Home Management;Electrical Stimulation;Therapeutic activities;Patient/family education;Spinal Manipulations;Joint Manipulations;Passive range of motion;Neuromuscular re-education;Manual techniques;Functional mobility training;Cryotherapy;Ultrasound;Moist Heat;DME Instruction;Iontophoresis '4mg'$ /ml Dexamethasone;Gait training;Stair training;Traction;Balance training;Therapeutic exercise;Biofeedback;Manual lymph drainage;Energy conservation;Splinting;Taping;Scar mobilization    PT Next Visit Plan --    PT Home Exercise Plan --    Consulted and Agree with Plan of Care Patient             Patient will benefit from skilled therapeutic intervention in order to improve the following deficits and impairments:  Abnormal gait, Decreased balance, Impaired flexibility, Difficulty walking, Decreased endurance, Decreased activity tolerance, Decreased strength, Decreased range of motion, Postural dysfunction, Impaired tone, Increased muscle spasms, Decreased mobility, Increased fascial restricitons, Improper body mechanics, Pain, Decreased coordination, Hypomobility, Decreased safety awareness  Visit Diagnosis: Sacrococcygeal disorders, not elsewhere classified  Other abnormalities of gait and mobility  Pain in right hip     Problem List Patient Active Problem List   Diagnosis Date Noted   Total knee replacement status, left 02/21/2021   Status post total knee replacement using cement, left 02/21/2021   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   Mixed incontinence 08/14/2019   Anxiety 06/11/2019   Elevated C-reactive  protein 06/11/2019   Exposure to mold 06/11/2019   Vitamin B12 deficiency (non anemic) 06/11/2019   Vitamin D deficiency 06/11/2019   Aching pain 04/01/2019   Headache 04/01/2019   Pelvic floor dysfunction 04/01/2019   Restless legs 04/01/2019   Dizziness 03/04/2019   Insomnia 03/04/2019   Tinnitus 03/04/2019   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 [place], [place], [address] Phone: [phone]   Fax:  [phone]  Name: Dorothy Turner MRN: [id number] Date of Birth: 1946-10-30

## 2021-08-01 ENCOUNTER — Ambulatory Visit: Payer: Medicare Other | Admitting: Physical Therapy

## 2021-08-01 ENCOUNTER — Other Ambulatory Visit: Payer: Self-pay

## 2021-08-01 DIAGNOSIS — M25651 Stiffness of right hip, not elsewhere classified: Secondary | ICD-10-CM

## 2021-08-01 DIAGNOSIS — R2689 Other abnormalities of gait and mobility: Secondary | ICD-10-CM

## 2021-08-01 DIAGNOSIS — G8929 Other chronic pain: Secondary | ICD-10-CM

## 2021-08-01 DIAGNOSIS — M25551 Pain in right hip: Secondary | ICD-10-CM

## 2021-08-01 DIAGNOSIS — M533 Sacrococcygeal disorders, not elsewhere classified: Secondary | ICD-10-CM

## 2021-08-01 DIAGNOSIS — M545 Low back pain, unspecified: Secondary | ICD-10-CM

## 2021-08-01 NOTE — Patient Instructions (Signed)
  Clam Shell 45 Degrees  Lying with hips and knees bent 45, one pillow between knees and ankles. Heel together, toes apart like ballerina,  Lift knee with exhale while pressing heels together. Be sure pelvis does not roll backward. Do not arch back. Do 20 times, each leg, 2 times per day.     Complimentary stretch: [employer] _ foot over _ thigh, opposite knee straight  3 breaths  * Keep pelvis levelled with tactile cue with hand under back of hips  * Slide the ankle of the supporting foot out to decrease the angle which can help level the pelvis      __  Deep core level 1 and 2  ( handout)

## 2021-08-01 NOTE — Therapy (Signed)
[place] MAIN [facility] SERVICES [address] [place], [place], [address] Phone: [phone]   Fax:  [phone]  Physical Therapy Treatment  Patient Details  Name: Dorothy Turner MRN: [id number] Date of Birth: May 23, 1946 Referring Provider (PT): Wannetta Sender   Encounter Date: 08/01/2021   PT End of Session - 08/01/21 1500     Visit Number 2    Number of Visits 10    Date for PT Re-Evaluation 10/03/21    PT Start Time 1404    PT Stop Time 1500    PT Time Calculation (min) 56 min    Activity Tolerance Patient tolerated treatment well    Behavior During Therapy [facility] for tasks assessed/performed             Past Medical History:  Diagnosis Date   Atrophic vaginitis    Breast cancer ([place]) 2001   right breast ca with lumpectomy and rad tx.    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  Stress   Hemochromatosis    Hyperlipidemia    IBS (irritable bowel syndrome)    Malignant neoplasm of breast ([place])    Paget disease, extra-mammary    Personal history of radiation therapy 2001   BREAST CA   Plantar fibromatosis    Tinnitus    Vertigo    several months ago    Past Surgical History:  Procedure Laterality Date   ANTERIOR AND POSTERIOR VAGINAL REPAIR  09/14/2019   [facility]   BASAL CELL CARCINOMA EXCISION     BLADDER SUSPENSION  09/14/2019   [facility]   BREAST BIOPSY Left 2001   benign, marked placed   BREAST BIOPSY Right 2001   positive   BREAST LUMPECTOMY Right    2001 with radiation   CATARACT EXTRACTION W/PHACO Left 03/15/2020   Procedure: CATARACT EXTRACTION PHACO AND INTRAOCULAR LENS PLACEMENT ([place]) LEFT 10.91 01:01.4;  Surgeon: Birder Robson, MD;  Location: [place];  Service: Ophthalmology;  Laterality: Left;   COLONOSCOPY WITH PROPOFOL N/A 06/10/2020   Procedure: COLONOSCOPY WITH PROPOFOL;  Surgeon: Lesly Rubenstein, MD;  Location: [facility]  ENDOSCOPY;  Service: Endoscopy;  Laterality: N/A;   left eye     reconstructive from skin cancer   MASTECTOMY Right 2001   partial   TOTAL KNEE ARTHROPLASTY Left 02/21/2021   Procedure: TOTAL KNEE ARTHROPLASTY;  Surgeon: Corky Mull, MD;  Location: [facility];  Service: Orthopedics;  Laterality: Left;   VAGINAL HYSTERECTOMY  09/14/2019   [facility]   VULVECTOMY PARTIAL  2019    There were no vitals filed for this visit.   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 L TKA 2022. Gynceology Hx: episiotomy with 3 vaginal deliveries.    Patient Stated Goals get in shape to avoid surgery                [facility] PT Assessment - 08/01/21 1409       Palpation   Spinal mobility sideflexion/ rotation equal    Palpation comment tightnes and tenderness at mm attachments at pubic symphysis, bulbospongiosus / ischiocavernosus R , suprapubic area R  Ambulation/Gait   Gait velocity 1.10 m/s    Gait Comments reciporcal gait pattern with arm swings                           [facility] Adult PT Treatment/Exercise - 08/01/21 1409       Neuro Re-ed    Neuro Re-ed Details  cued for deep core and clam ( required to not push stomach)      Manual Therapy   Manual therapy comments STM/MWM at problem areas noted in assessment                 PT Long Term Goals - 07/25/21 1628       PT LONG TERM GOAL #1   Title Pt will demo proper body mechanics for housecleaning, gardening, fitness routine  to minimize straining/ worsening of pelvic floor adysfunctions and prolapse.    Time 4    Period Weeks    Status New    Target Date 08/22/21      PT LONG TERM GOAL #2   Title Pt will report complete emptying of bladder  and not need to return a second time to the toilet across 1 week in order edemo improved pelvic floor coordination and strengthening    Time 6    Period Weeks    Status New    Target Date 09/05/21      PT LONG TERM GOAL #3   Title Pt will demo improved scores of FOTO Urinary from 61 pts to > 71 pts, Prolapse from 8 pts to < 4 pts  in order to improve incontinence and QOL    Time 10    Period Weeks    Status New    Target Date 10/03/21      PT LONG TERM GOAL #4   Title Pt will report being able to sit for a 30 min and then not need to wait to start walking and also decreased pain of 50% with lying on R side to demo improved R hip pain and return to ADLs    Baseline not able to    Time 8    Period Weeks    Status New    Target Date 09/19/21      PT LONG TERM GOAL #5   Title Pt will demo levelled pelvic girdle and shoulder across 2 weeks in order to progress to deep core strengthening and pelvic floor coordination    Baseline L shoulder, hip higher, R  thoracic, L lumbar convex curve    Time 2    Period Weeks    Target Date 08/09/21      Additional Long Term Goals   Additional Long Term Goals Yes      PT LONG TERM GOAL #6   Title Pt will demo increased gait speed from 1.04 m/s to > 1.10 m/s and less genu valgus on L  and report decreased R hip pain to improve aerobic health    Time 8    Period Weeks    Status New    Target Date 09/20/21      PT LONG TERM GOAL #7   Title Pt will demo decreased scar adhesions from Lumpectomy, partial mastectomy on R with completion of tomaxifen and radiation 2001, bladder sling procedure with hysterectomy 2020, Vulvectomy partialy L in order to optimize mobility    Time 8    Period Weeks    Status New  Target Date 09/20/21      PT LONG TERM GOAL #8   Title Pt will report no more radiating pain from R hip to knee when navigating stairs with single UE support    Time 8    Period Weeks    Status New    Target Date 09/20/21      PT LONG  TERM GOAL  #9   TITLE Pt will demo proper technique and alignment with fitness and gym routine to mimimize worsening of prolapse    Time 6    Period Weeks    Status New    Target Date 09/06/21                   Plan - 08/01/21 1500     Clinical Impression Statement Pt demo'd decreased mm tightness at R anterior pelvic floor mm and at mm attachments at pubic symphysis after Tx. Pt progressed to clam and deep core level 1-2 with cues for correct technique.  Pt continues to benefit from skilled PT. Plan to assess pelvic floor next session.    Personal Factors and Comorbidities Age;Comorbidity 1;Comorbidity 2;Fitness;Past/Current Experience;Sex    Comorbidities 09/14/2019  Anterior and posterior vaginal repair   09/14/2019  Bladder suspension   09/14/2019  Vaginal hysterectomy   2019  Vulvectomy partial  2001  Breast biopsy (Left)   2001  Breast biopsy (Right)   2001 lumpectomy  (Right)   Date Unknown  Basal cell carcinoma excision  Date Unknown  Breast lumpectomy (Right)    Examination-Activity Limitations Continence;Other    Stability/Clinical Decision Making Evolving/Moderate complexity    Rehab Potential Good    PT Frequency 1x / week    PT Duration Other (comment)   10   PT Treatment/Interventions ADLs/Self Care Home Management;Electrical Stimulation;Therapeutic activities;Patient/family education;Spinal Manipulations;Joint Manipulations;Passive range of motion;Neuromuscular re-education;Manual techniques;Functional mobility training;Cryotherapy;Ultrasound;Moist Heat;DME Instruction;Iontophoresis 4mg /ml Dexamethasone;Gait training;Stair training;Traction;Balance training;Therapeutic exercise;Biofeedback;Manual lymph drainage;Energy conservation;Splinting;Taping;Scar mobilization    Consulted and Agree with Plan of Care Patient             Patient will benefit from skilled therapeutic intervention in order to improve the following deficits and impairments:  Abnormal gait,  Decreased balance, Impaired flexibility, Difficulty walking, Decreased endurance, Decreased activity tolerance, Decreased strength, Decreased range of motion, Postural dysfunction, Impaired tone, Increased muscle spasms, Decreased mobility, Increased fascial restricitons, Improper body mechanics, Pain, Decreased coordination, Hypomobility, Decreased safety awareness  Visit Diagnosis: Sacrococcygeal disorders, not elsewhere classified  Pain in right hip  Other abnormalities of gait and mobility  Stiffness of right hip, not elsewhere classified  Chronic right-sided low back pain without sciatica     Problem List Patient Active Problem List   Diagnosis Date Noted   Total knee replacement status, left 02/21/2021   Status post total knee replacement using cement, left 02/21/2021   Hereditary hemochromatosis ([place]) 06/24/2020   Mixed incontinence 08/14/2019   Anxiety 06/11/2019   Elevated C-reactive protein 06/11/2019   Exposure to mold 06/11/2019   Vitamin B12 deficiency (non anemic) 06/11/2019   Vitamin D deficiency 06/11/2019   Aching pain 04/01/2019   Headache 04/01/2019   Pelvic floor dysfunction 04/01/2019   Restless legs 04/01/2019   Dizziness 03/04/2019   Insomnia 03/04/2019   Tinnitus 03/04/2019   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 [place], [place],  [address] Phone: [phone]   Fax:  [phone]  Name: Dorothy Turner MRN: [id number] Date of Birth: [birth year]

## 2021-08-07 ENCOUNTER — Other Ambulatory Visit: Payer: Self-pay

## 2021-08-07 ENCOUNTER — Ambulatory Visit: Payer: Medicare Other | Admitting: Physical Therapy

## 2021-08-07 DIAGNOSIS — R2689 Other abnormalities of gait and mobility: Secondary | ICD-10-CM

## 2021-08-07 DIAGNOSIS — M533 Sacrococcygeal disorders, not elsewhere classified: Secondary | ICD-10-CM | POA: Diagnosis not present

## 2021-08-07 DIAGNOSIS — G8929 Other chronic pain: Secondary | ICD-10-CM

## 2021-08-07 DIAGNOSIS — M25651 Stiffness of right hip, not elsewhere classified: Secondary | ICD-10-CM

## 2021-08-07 DIAGNOSIS — M25551 Pain in right hip: Secondary | ICD-10-CM

## 2021-08-07 NOTE — Patient Instructions (Signed)
  Lengthen Back rib by R  shoulder    Lie on L  side , pillow between knees and under head  Pull  arm overhead over mattress, grab the edge of mattress,pull it upward, drawing elbow away from ears  Breathing 10 reps  Open book (handout)  Lying on  _ side , rotating  __ only this week  Rotating onto pillow /yoga block  Pillow/ Block between knees  10 reps   __   _angel wings, lower elbows down , keep arms touching bed  10 reps   __   wear shoe lift in R shoe

## 2021-08-07 NOTE — Therapy (Signed)
[place] MAIN [facility] SERVICES [address] [place], [place], [address] Phone: [phone]   Fax:  [phone]  Physical Therapy Treatment  Patient Details  Name: Dorothy Turner MRN: [id number] Date of Birth: 1945-11-24 Referring Provider (PT): Wannetta Sender   Encounter Date: 08/07/2021   PT End of Session - 08/07/21 1510     Visit Number 3    Number of Visits 10    Date for PT Re-Evaluation 10/03/21    PT Start Time 1505    PT Stop Time 1600    PT Time Calculation (min) 55 min    Activity Tolerance Patient tolerated treatment well    Behavior During Therapy [facility] for tasks assessed/performed             Past Medical History:  Diagnosis Date   Atrophic vaginitis    Breast cancer ([place]) 2001   right breast ca with lumpectomy and rad tx.    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  Stress   Hemochromatosis    Hyperlipidemia    IBS (irritable bowel syndrome)    Malignant neoplasm of breast ([place])    Paget disease, extra-mammary    Personal history of radiation therapy 2001   BREAST CA   Plantar fibromatosis    Tinnitus    Vertigo    several months ago    Past Surgical History:  Procedure Laterality Date   ANTERIOR AND POSTERIOR VAGINAL REPAIR  09/14/2019   [facility]   BASAL CELL CARCINOMA EXCISION     BLADDER SUSPENSION  09/14/2019   [facility]   BREAST BIOPSY Left 2001   benign, marked placed   BREAST BIOPSY Right 2001   positive   BREAST LUMPECTOMY Right    2001 with radiation   CATARACT EXTRACTION W/PHACO Left 03/15/2020   Procedure: CATARACT EXTRACTION PHACO AND INTRAOCULAR LENS PLACEMENT ([place]) LEFT 10.91 01:01.4;  Surgeon: Birder Robson, MD;  Location: [place];  Service: Ophthalmology;  Laterality: Left;   COLONOSCOPY WITH PROPOFOL N/A 06/10/2020   Procedure: COLONOSCOPY WITH PROPOFOL;  Surgeon: Lesly Rubenstein, MD;  Location: [facility]  ENDOSCOPY;  Service: Endoscopy;  Laterality: N/A;   left eye     reconstructive from skin cancer   MASTECTOMY Right 2001   partial   TOTAL KNEE ARTHROPLASTY Left 02/21/2021   Procedure: TOTAL KNEE ARTHROPLASTY;  Surgeon: Corky Mull, MD;  Location: [facility] ORS;  Service: Orthopedics;  Laterality: Left;   VAGINAL HYSTERECTOMY  09/14/2019   [facility]   VULVECTOMY PARTIAL  2019    There were no vitals filed for this visit.   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 L TKA 2022. Gynceology Hx: episiotomy with 3 vaginal deliveries.    Patient Stated Goals get in shape to avoid surgery                [facility] PT Assessment - 08/07/21 1511       Observation/Other Assessments   Scoliosis L iliac crest higher ( levelled after shoe lift in R shoe)      Palpation   Spinal mobility tightness along T 10-12 ribs and  segments, medial scapla tightness L    SI assessment  L iliac crest higher                           [facility] Adult PT Treatment/Exercise - 08/07/21 1541       Therapeutic Activites    Other Therapeutic Activities provided shoe lift      Neuro Re-ed    Neuro Re-ed Details  cued for new HEP to promote mobility T/L junction and diaphragmatic excursion, mobility scapula L      Manual Therapy   Manual therapy comments STM/MWM at problem areas noted in assessment to promote medial scapula mm / intercostal mm mobility and diaphragmatic excursion                       PT Short Term Goals - 07/26/21 1756       PT SHORT TERM GOAL #1   Title Pt will be independent with HEP in order to improve continence and mobility    Time 4    Period Weeks    Status New    Target Date  08/23/21               PT Long Term Goals - 07/25/21 1628       PT LONG TERM GOAL #1   Title Pt will demo proper body mechanics for housecleaning, gardening, fitness routine  to minimize straining/ worsening of pelvic floor adysfunctions and prolapse.    Time 4    Period Weeks    Status New    Target Date 08/22/21      PT LONG TERM GOAL #2   Title Pt will report complete emptying of bladder and not need to return a second time to the toilet across 1 week in order edemo improved pelvic floor coordination and strengthening    Time 6    Period Weeks    Status New    Target Date 09/05/21      PT LONG TERM GOAL #3   Title Pt will demo improved scores of FOTO Urinary from 61 pts to > 71 pts, Prolapse from 8 pts to < 4 pts  in order to improve incontinence and QOL    Time 10    Period Weeks    Status New    Target Date 10/03/21      PT LONG TERM GOAL #4   Title Pt will report being able to sit for a 30 min and then not need to wait to start walking and also decreased pain of 50% with lying on R side to demo improved R hip pain and return to ADLs    Baseline not able to    Time 8    Period Weeks    Status New    Target Date 09/19/21      PT LONG TERM GOAL #5   Title Pt will demo levelled pelvic girdle and shoulder across 2 weeks in order to progress to deep core strengthening and pelvic floor coordination    Baseline L shoulder, hip higher, R  thoracic, L lumbar convex curve    Time 2    Period Weeks    Target Date 08/09/21      Additional Long Term Goals   Additional Long Term Goals Yes      PT LONG TERM GOAL #6   Title Pt will demo increased gait speed from 1.04 m/s to > 1.10  m/s and less genu valgus on L  and report decreased R hip pain to improve aerobic health    Time 8    Period Weeks    Status New    Target Date 09/20/21      PT LONG TERM GOAL #7   Title Pt will demo decreased scar adhesions from Lumpectomy, partial mastectomy on R with completion of  tomaxifen and radiation 2001, bladder sling procedure with hysterectomy 2020, Vulvectomy partialy L in order to optimize mobility    Time 8    Period Weeks    Status New    Target Date 09/20/21      PT LONG TERM GOAL #8   Title Pt will report no more radiating pain from R hip to knee when navigating stairs with single UE support    Time 8    Period Weeks    Status New    Target Date 09/20/21      PT LONG TERM GOAL  #9   TITLE Pt will demo proper technique and alignment with fitness and gym routine to mimimize worsening of prolapse    Time 6    Period Weeks    Status New    Target Date 09/06/21                   Plan - 08/07/21 1602     Clinical Impression Statement Pt demonstrated more levelled pelvic girdle with shoe lift in R shoe. Focused on improving T/L junction convex curve and mobility improved in that area which helped with diaphragmatic excursion. Pt continues to benefit from skilled PT and will be ready for deep core HEP at next session.    Personal Factors and Comorbidities Age;Comorbidity 1;Comorbidity 2;Fitness;Past/Current Experience;Sex    Comorbidities 09/14/2019  Anterior and posterior vaginal repair   09/14/2019  Bladder suspension   09/14/2019  Vaginal hysterectomy   2019  Vulvectomy partial  2001  Breast biopsy (Left)   2001  Breast biopsy (Right)   2001 lumpectomy  (Right)   Date Unknown  Basal cell carcinoma excision  Date Unknown  Breast lumpectomy (Right)    Examination-Activity Limitations Continence;Other    Stability/Clinical Decision Making Evolving/Moderate complexity    Rehab Potential Good    PT Frequency 1x / week    PT Duration Other (comment)   10   PT Treatment/Interventions ADLs/Self Care Home Management;Electrical Stimulation;Therapeutic activities;Patient/family education;Spinal Manipulations;Joint Manipulations;Passive range of motion;Neuromuscular re-education;Manual techniques;Functional mobility training;Cryotherapy;Ultrasound;Moist  Heat;DME Instruction;Iontophoresis 4mg /ml Dexamethasone;Gait training;Stair training;Traction;Balance training;Therapeutic exercise;Biofeedback;Manual lymph drainage;Energy conservation;Splinting;Taping;Scar mobilization    Consulted and Agree with Plan of Care Patient             Patient will benefit from skilled therapeutic intervention in order to improve the following deficits and impairments:  Abnormal gait, Decreased balance, Impaired flexibility, Difficulty walking, Decreased endurance, Decreased activity tolerance, Decreased strength, Decreased range of motion, Postural dysfunction, Impaired tone, Increased muscle spasms, Decreased mobility, Increased fascial restricitons, Improper body mechanics, Pain, Decreased coordination, Hypomobility, Decreased safety awareness  Visit Diagnosis: Sacrococcygeal disorders, not elsewhere classified  Pain in right hip  Other abnormalities of gait and mobility  Stiffness of right hip, not elsewhere classified  Chronic right-sided low back pain without sciatica     Problem List Patient Active Problem List   Diagnosis Date Noted   Total knee replacement status, left 02/21/2021   Status post total knee replacement using cement, left 02/21/2021   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   Mixed incontinence 08/14/2019   Anxiety  06/11/2019   Elevated C-reactive protein 06/11/2019   Exposure to mold 06/11/2019   Vitamin B12 deficiency (non anemic) 06/11/2019   Vitamin D deficiency 06/11/2019   Aching pain 04/01/2019   Headache 04/01/2019   Pelvic floor dysfunction 04/01/2019   Restless legs 04/01/2019   Dizziness 03/04/2019   Insomnia 03/04/2019   Tinnitus 03/04/2019   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   Abdominal bloating 02/20/2018   Dysuria 02/20/2018   Uterine prolapse 02/20/2018   Primary osteoarthritis of left knee 03/25/2017   Heart palpitations 02/09/2016   Gastro-esophageal reflux disease without esophagitis  04/18/2015   Hyperlipidemia 04/18/2015   Irritable bowel syndrome without diarrhea 04/18/2015   Atrophic vaginitis 04/18/2015   Malignant neoplasm of breast ([place]) 04/18/2015   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

## 2021-08-14 ENCOUNTER — Ambulatory Visit: Payer: Medicare Other | Attending: Obstetrics and Gynecology | Admitting: Physical Therapy

## 2021-08-14 ENCOUNTER — Other Ambulatory Visit: Payer: Self-pay

## 2021-08-14 DIAGNOSIS — R2689 Other abnormalities of gait and mobility: Secondary | ICD-10-CM | POA: Diagnosis present

## 2021-08-14 DIAGNOSIS — M533 Sacrococcygeal disorders, not elsewhere classified: Secondary | ICD-10-CM | POA: Diagnosis present

## 2021-08-14 DIAGNOSIS — M25651 Stiffness of right hip, not elsewhere classified: Secondary | ICD-10-CM | POA: Insufficient documentation

## 2021-08-14 DIAGNOSIS — M545 Low back pain, unspecified: Secondary | ICD-10-CM | POA: Insufficient documentation

## 2021-08-14 DIAGNOSIS — M25551 Pain in right hip: Secondary | ICD-10-CM | POA: Diagnosis not present

## 2021-08-14 DIAGNOSIS — G8929 Other chronic pain: Secondary | ICD-10-CM | POA: Diagnosis present

## 2021-08-14 NOTE — Patient Instructions (Addendum)
R leg forward , L leg back, lengthen trunk, R hand arching and twist to the R  10 reps  X 3-5 day  Heel raises 10 reps single UE on rail  X 3-5 x day   Stairs: with hand on rail  Step to  UP with the good, down with the hurt

## 2021-08-14 NOTE — Therapy (Signed)
[place] MAIN [facility] SERVICES [address] [place], [place], [address] Phone: [phone]   Fax:  [phone]  Physical Therapy Treatment  Patient Details  Name: Dorothy Turner MRN: [id number] Date of Birth: [birth year] Referring Provider (PT): Wannetta Sender   Encounter Date: 08/14/2021   PT End of Session - 08/14/21 1308     Visit Number 4    Number of Visits 10    Date for PT Re-Evaluation 10/03/21    PT Start Time 2774    PT Stop Time 1400    PT Time Calculation (min) 57 min    Activity Tolerance Patient tolerated treatment well    Behavior During Therapy [facility] for tasks assessed/performed             Past Medical History:  Diagnosis Date   Atrophic vaginitis    Breast cancer ([place]) 2001   right breast ca with lumpectomy and rad tx.    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  Stress   Hemochromatosis    Hyperlipidemia    IBS (irritable bowel syndrome)    Malignant neoplasm of breast ([place])    Paget disease, extra-mammary    Personal history of radiation therapy 2001   BREAST CA   Plantar fibromatosis    Tinnitus    Vertigo    several months ago    Past Surgical History:  Procedure Laterality Date   ANTERIOR AND POSTERIOR VAGINAL REPAIR  09/14/2019   [facility]   BASAL CELL CARCINOMA EXCISION     BLADDER SUSPENSION  09/14/2019   [facility]   BREAST BIOPSY Left 2001   benign, marked placed   BREAST BIOPSY Right 2001   positive   BREAST LUMPECTOMY Right    2001 with radiation   CATARACT EXTRACTION W/PHACO Left 03/15/2020   Procedure: CATARACT EXTRACTION PHACO AND INTRAOCULAR LENS PLACEMENT ([place]) LEFT 10.91 01:01.4;  Surgeon: Birder Robson, MD;  Location: [place];  Service: Ophthalmology;  Laterality: Left;   COLONOSCOPY WITH PROPOFOL N/A 06/10/2020   Procedure: COLONOSCOPY WITH PROPOFOL;  Surgeon: Lesly Rubenstein, MD;  Location: [facility]  ENDOSCOPY;  Service: Endoscopy;  Laterality: N/A;   left eye     reconstructive from skin cancer   MASTECTOMY Right 2001   partial   TOTAL KNEE ARTHROPLASTY Left 02/21/2021   Procedure: TOTAL KNEE ARTHROPLASTY;  Surgeon: Corky Mull, MD;  Location: [facility] ORS;  Service: Orthopedics;  Laterality: Left;   VAGINAL HYSTERECTOMY  09/14/2019   [facility]   VULVECTOMY PARTIAL  2019    There were no vitals filed for this visit.   Subjective Assessment - 08/14/21 1308     Subjective Pt wants to know if doing the treadmill is ok. 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 L TKA 2022. Gynceology Hx: episiotomy with 3 vaginal deliveries.    Patient Stated Goals get in shape to avoid surgery                [facility] PT Assessment - 08/14/21 1620       Strength   Overall Strength Comments PF w/ UE support L 8 reps 4/5, R 3/5 14 reps      Palpation  Spinal mobility tightness at intercostals T10-12  anterior/ posterior/lateral, hypomobile L T10-12      Ambulation/Gait   Gait Comments radiating pain with hip flexion R on stairs, poor PF                           [facility] Adult PT Treatment/Exercise - 08/14/21 1620       Neuro Re-ed    Neuro Re-ed Details  cued for one sided stretch to address concave side of curve , cued for step to stair pattern to minimize pain, added PF to HEP      Manual Therapy   Manual therapy comments STM/MWM at problem areas noted in assessment to promote less posterior rotation of thorax at scoliotic curve                       PT Short Term Goals - 07/26/21 1756       PT SHORT TERM GOAL #1   Title Pt will be independent with HEP in order to improve continence and mobility    Time 4    Period Weeks    Status New     Target Date 08/23/21               PT Long Term Goals - 07/25/21 1628       PT LONG TERM GOAL #1   Title Pt will demo proper body mechanics for housecleaning, gardening, fitness routine  to minimize straining/ worsening of pelvic floor adysfunctions and prolapse.    Time 4    Period Weeks    Status New    Target Date 08/22/21      PT LONG TERM GOAL #2   Title Pt will report complete emptying of bladder and not need to return a second time to the toilet across 1 week in order edemo improved pelvic floor coordination and strengthening    Time 6    Period Weeks    Status New    Target Date 09/05/21      PT LONG TERM GOAL #3   Title Pt will demo improved scores of FOTO Urinary from 61 pts to > 71 pts, Prolapse from 8 pts to < 4 pts  in order to improve incontinence and QOL    Time 10    Period Weeks    Status New    Target Date 10/03/21      PT LONG TERM GOAL #4   Title Pt will report being able to sit for a 30 min and then not need to wait to start walking and also decreased pain of 50% with lying on R side to demo improved R hip pain and return to ADLs    Baseline not able to    Time 8    Period Weeks    Status New    Target Date 09/19/21      PT LONG TERM GOAL #5   Title Pt will demo levelled pelvic girdle and shoulder across 2 weeks in order to progress to deep core strengthening and pelvic floor coordination    Baseline L shoulder, hip higher, R  thoracic, L lumbar convex curve    Time 2    Period Weeks    Target Date 08/09/21      Additional Long Term Goals   Additional Long Term Goals Yes      PT LONG TERM GOAL #6   Title Pt will demo  increased gait speed from 1.04 m/s to > 1.10 m/s and less genu valgus on L  and report decreased R hip pain to improve aerobic health    Time 8    Period Weeks    Status New    Target Date 09/20/21      PT LONG TERM GOAL #7   Title Pt will demo decreased scar adhesions from Lumpectomy, partial mastectomy on R with  completion of tomaxifen and radiation 2001, bladder sling procedure with hysterectomy 2020, Vulvectomy partialy L in order to optimize mobility    Time 8    Period Weeks    Status New    Target Date 09/20/21      PT LONG TERM GOAL #8   Title Pt will report no more radiating pain from R hip to knee when navigating stairs with single UE support    Time 8    Period Weeks    Status New    Target Date 09/20/21      PT LONG TERM GOAL  #9   TITLE Pt will demo proper technique and alignment with fitness and gym routine to mimimize worsening of prolapse    Time 6    Period Weeks    Status New    Target Date 09/06/21                   Plan - 08/14/21 1619     Clinical Impression Statement Pt continued good carry over with levelled pelvic girdle but still continued to show scoliotic curves at L T/L junction and required manual Tx and one sided stretch to minimize concave curve. Pt showed PF weakness with stair ascension and R radiating pain with hip flexion R. Step to pattern was no painful. Added PF strengthening to HEP. Pt continues to benefit from skilled PT.    Personal Factors and Comorbidities Age;Comorbidity 1;Comorbidity 2;Fitness;Past/Current Experience;Sex    Comorbidities 09/14/2019  Anterior and posterior vaginal repair   09/14/2019  Bladder suspension   09/14/2019  Vaginal hysterectomy   2019  Vulvectomy partial  2001  Breast biopsy (Left)   2001  Breast biopsy (Right)   2001 lumpectomy  (Right)   Date Unknown  Basal cell carcinoma excision  Date Unknown  Breast lumpectomy (Right)    Examination-Activity Limitations Continence;Other    Stability/Clinical Decision Making Evolving/Moderate complexity    Rehab Potential Good    PT Frequency 1x / week    PT Duration Other (comment)   10   PT Treatment/Interventions ADLs/Self Care Home Management;Electrical Stimulation;Therapeutic activities;Patient/family education;Spinal Manipulations;Joint Manipulations;Passive range of  motion;Neuromuscular re-education;Manual techniques;Functional mobility training;Cryotherapy;Ultrasound;Moist Heat;DME Instruction;Iontophoresis 4mg /ml Dexamethasone;Gait training;Stair training;Traction;Balance training;Therapeutic exercise;Biofeedback;Manual lymph drainage;Energy conservation;Splinting;Taping;Scar mobilization    Consulted and Agree with Plan of Care Patient             Patient will benefit from skilled therapeutic intervention in order to improve the following deficits and impairments:  Abnormal gait, Decreased balance, Impaired flexibility, Difficulty walking, Decreased endurance, Decreased activity tolerance, Decreased strength, Decreased range of motion, Postural dysfunction, Impaired tone, Increased muscle spasms, Decreased mobility, Increased fascial restricitons, Improper body mechanics, Pain, Decreased coordination, Hypomobility, Decreased safety awareness  Visit Diagnosis: Pain in right hip  Sacrococcygeal disorders, not elsewhere classified  Other abnormalities of gait and mobility  Stiffness of right hip, not elsewhere classified  Chronic right-sided low back pain without sciatica     Problem List Patient Active Problem List   Diagnosis Date Noted   Total knee replacement status, left  02/21/2021   Status post total knee replacement using cement, left 02/21/2021   Hereditary hemochromatosis ([place]) 06/24/2020   Mixed incontinence 08/14/2019   Anxiety 06/11/2019   Elevated C-reactive protein 06/11/2019   Exposure to mold 06/11/2019   Vitamin B12 deficiency (non anemic) 06/11/2019   Vitamin D deficiency 06/11/2019   Aching pain 04/01/2019   Headache 04/01/2019   Pelvic floor dysfunction 04/01/2019   Restless legs 04/01/2019   Dizziness 03/04/2019   Insomnia 03/04/2019   Tinnitus 03/04/2019   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   Abdominal bloating 02/20/2018   Dysuria 02/20/2018   Uterine prolapse 02/20/2018   Primary osteoarthritis of  left knee 03/25/2017   Heart palpitations 02/09/2016   Gastro-esophageal reflux disease without esophagitis 04/18/2015   Hyperlipidemia 04/18/2015   Irritable bowel syndrome without diarrhea 04/18/2015   Atrophic vaginitis 04/18/2015   Malignant neoplasm of breast ([place]) 04/18/2015   Plantar fascial fibromatosis 04/18/2015   ANA positive 45/91/3685   Lichen 99/23/4144    Jerl Mina, PT 08/14/2021, 4:27 PM  [place] MAIN [facility] SERVICES [address] [place], [place], [address] Phone: [phone]   Fax:  [phone]  Name: Dorothy Turner MRN: [id number] Date of Birth: 13-Dec-1945

## 2021-08-24 ENCOUNTER — Ambulatory Visit: Payer: Medicare Other | Admitting: Physical Therapy

## 2021-08-28 ENCOUNTER — Other Ambulatory Visit: Payer: Self-pay

## 2021-08-28 ENCOUNTER — Ambulatory Visit: Payer: Medicare Other | Admitting: Physical Therapy

## 2021-08-28 DIAGNOSIS — G8929 Other chronic pain: Secondary | ICD-10-CM

## 2021-08-28 DIAGNOSIS — M25551 Pain in right hip: Secondary | ICD-10-CM

## 2021-08-28 DIAGNOSIS — R2689 Other abnormalities of gait and mobility: Secondary | ICD-10-CM

## 2021-08-28 DIAGNOSIS — M533 Sacrococcygeal disorders, not elsewhere classified: Secondary | ICD-10-CM

## 2021-08-28 DIAGNOSIS — M25651 Stiffness of right hip, not elsewhere classified: Secondary | ICD-10-CM

## 2021-08-28 NOTE — Therapy (Signed)
[place] MAIN [facility] SERVICES [address] [place], [place], [address] Phone: [phone]   Fax:  [phone]  Physical Therapy Treatment  Patient Details  Name: Dorothy Turner MRN: [id number] Date of Birth: 1946-02-10 Referring Provider (PT): Wannetta Sender   Encounter Date: 08/28/2021   PT End of Session - 08/28/21 1614     Visit Number 5    Number of Visits 10    Date for PT Re-Evaluation 10/03/21    PT Start Time 7741    PT Stop Time 1700    PT Time Calculation (min) 50 min    Activity Tolerance Patient tolerated treatment well    Behavior During Therapy [facility] for tasks assessed/performed             Past Medical History:  Diagnosis Date   Atrophic vaginitis    Breast cancer ([place]) 2001   right breast ca with lumpectomy and rad tx.    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  Stress   Hemochromatosis    Hyperlipidemia    IBS (irritable bowel syndrome)    Malignant neoplasm of breast ([place])    Paget disease, extra-mammary    Personal history of radiation therapy 2001   BREAST CA   Plantar fibromatosis    Tinnitus    Vertigo    several months ago    Past Surgical History:  Procedure Laterality Date   ANTERIOR AND POSTERIOR VAGINAL REPAIR  09/14/2019   [facility]   BASAL CELL CARCINOMA EXCISION     BLADDER SUSPENSION  09/14/2019   [facility]   BREAST BIOPSY Left 2001   benign, marked placed   BREAST BIOPSY Right 2001   positive   BREAST LUMPECTOMY Right    2001 with radiation   CATARACT EXTRACTION W/PHACO Left 03/15/2020   Procedure: CATARACT EXTRACTION PHACO AND INTRAOCULAR LENS PLACEMENT ([place]) LEFT 10.91 01:01.4;  Surgeon: Birder Robson, MD;  Location: [place];  Service: Ophthalmology;  Laterality: Left;   COLONOSCOPY WITH PROPOFOL N/A 06/10/2020   Procedure: COLONOSCOPY WITH PROPOFOL;  Surgeon: Lesly Rubenstein, MD;  Location: [facility]  ENDOSCOPY;  Service: Endoscopy;  Laterality: N/A;   left eye     reconstructive from skin cancer   MASTECTOMY Right 2001   partial   TOTAL KNEE ARTHROPLASTY Left 02/21/2021   Procedure: TOTAL KNEE ARTHROPLASTY;  Surgeon: Corky Mull, MD;  Location: [facility] ORS;  Service: Orthopedics;  Laterality: Left;   VAGINAL HYSTERECTOMY  09/14/2019   [facility]   VULVECTOMY PARTIAL  2019    There were no vitals filed for this visit.   Subjective Assessment - 08/28/21 1614     Subjective Pt reported 50% improved R hip pain.    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 L TKA 2022. Gynceology Hx: episiotomy with 3 vaginal deliveries.    Patient Stated Goals get in shape to avoid surgery                [facility] PT Assessment - 08/28/21 1719       Ambulation/Gait   Gait Comments pre Tx: limited thoracic rotation L >R, ( post Tx: more reciporcal )  [place] Adult PT Treatment/Exercise - 08/28/21 1719       Neuro Re-ed    Neuro Re-ed Details  cued for scapular/ cervical restraction with resistance abnd, cued for deep core coordination with band HEP      Manual Therapy   Manual therapy comments STM/MWM at problem areas noted in assessment to promote less posterior rotation of thorax at scoliotic curve                       PT Short Term Goals - 07/26/21 1756       PT SHORT TERM GOAL #1   Title Pt will be independent with HEP in order to improve continence and mobility    Time 4    Period Weeks    Status New    Target Date 08/23/21               PT Long Term Goals - 07/25/21 1628       PT LONG TERM GOAL #1   Title Pt will demo proper body mechanics for housecleaning, gardening, fitness routine  to minimize straining/ worsening of pelvic floor  adysfunctions and prolapse.    Time 4    Period Weeks    Status New    Target Date 08/22/21      PT LONG TERM GOAL #2   Title Pt will report complete emptying of bladder and not need to return a second time to the toilet across 1 week in order edemo improved pelvic floor coordination and strengthening    Time 6    Period Weeks    Status New    Target Date 09/05/21      PT LONG TERM GOAL #3   Title Pt will demo improved scores of FOTO Urinary from 61 pts to > 71 pts, Prolapse from 8 pts to < 4 pts  in order to improve incontinence and QOL    Time 10    Period Weeks    Status New    Target Date 10/03/21      PT LONG TERM GOAL #4   Title Pt will report being able to sit for a 30 min and then not need to wait to start walking and also decreased pain of 50% with lying on R side to demo improved R hip pain and return to ADLs    Baseline not able to    Time 8    Period Weeks    Status New    Target Date 09/19/21      PT LONG TERM GOAL #5   Title Pt will demo levelled pelvic girdle and shoulder across 2 weeks in order to progress to deep core strengthening and pelvic floor coordination    Baseline L shoulder, hip higher, R  thoracic, L lumbar convex curve    Time 2    Period Weeks    Target Date 08/09/21      Additional Long Term Goals   Additional Long Term Goals Yes      PT LONG TERM GOAL #6   Title Pt will demo increased gait speed from 1.04 m/s to > 1.10 m/s and less genu valgus on L  and report decreased R hip pain to improve aerobic health    Time 8    Period Weeks    Status New    Target Date 09/20/21      PT LONG TERM GOAL #7   Title Pt will demo decreased scar adhesions  from Lumpectomy, partial mastectomy on R with completion of tomaxifen and radiation 2001, bladder sling procedure with hysterectomy 2020, Vulvectomy partialy L in order to optimize mobility    Time 8    Period Weeks    Status New    Target Date 09/20/21      PT LONG TERM GOAL #8   Title Pt will  report no more radiating pain from R hip to knee when navigating stairs with single UE support    Time 8    Period Weeks    Status New    Target Date 09/20/21      PT LONG TERM GOAL  #9   TITLE Pt will demo proper technique and alignment with fitness and gym routine to mimimize worsening of prolapse    Time 6    Period Weeks    Status New    Target Date 09/06/21                   Plan - 08/28/21 1639     Clinical Impression Statement Pt further benefited manual Tx to address T/L junction posterior curve that limits her thoracic/pelvic rotation and lateral and anterior excursion of diaphragm. Pt tolerated manual Tx without complaints and demo'd more reciporcal gait pattern and optimal diaphragm excursion . Today's achievements will help pt advance towards deep core coordination and assessment of pelvic floor at next session . Pt continues to benefit from skilled PT.      Personal Factors and Comorbidities Age;Comorbidity 1;Comorbidity 2;Fitness;Past/Current Experience;Sex    Comorbidities 09/14/2019  Anterior and posterior vaginal repair   09/14/2019  Bladder suspension   09/14/2019  Vaginal hysterectomy   2019  Vulvectomy partial  2001  Breast biopsy (Left)   2001  Breast biopsy (Right)   2001 lumpectomy  (Right)   Date Unknown  Basal cell carcinoma excision  Date Unknown  Breast lumpectomy (Right)    Examination-Activity Limitations Continence;Other    Stability/Clinical Decision Making Evolving/Moderate complexity    Rehab Potential Good    PT Frequency 1x / week    PT Duration Other (comment)   10   PT Treatment/Interventions ADLs/Self Care Home Management;Electrical Stimulation;Therapeutic activities;Patient/family education;Spinal Manipulations;Joint Manipulations;Passive range of motion;Neuromuscular re-education;Manual techniques;Functional mobility training;Cryotherapy;Ultrasound;Moist Heat;DME Instruction;Iontophoresis 4mg /ml Dexamethasone;Gait training;Stair  training;Traction;Balance training;Therapeutic exercise;Biofeedback;Manual lymph drainage;Energy conservation;Splinting;Taping;Scar mobilization    Consulted and Agree with Plan of Care Patient             Patient will benefit from skilled therapeutic intervention in order to improve the following deficits and impairments:  Abnormal gait, Decreased balance, Impaired flexibility, Difficulty walking, Decreased endurance, Decreased activity tolerance, Decreased strength, Decreased range of motion, Postural dysfunction, Impaired tone, Increased muscle spasms, Decreased mobility, Increased fascial restricitons, Improper body mechanics, Pain, Decreased coordination, Hypomobility, Decreased safety awareness  Visit Diagnosis: Pain in right hip  Sacrococcygeal disorders, not elsewhere classified  Other abnormalities of gait and mobility  Stiffness of right hip, not elsewhere classified  Chronic right-sided low back pain without sciatica     Problem List Patient Active Problem List   Diagnosis Date Noted   Total knee replacement status, left 02/21/2021   Status post total knee replacement using cement, left 02/21/2021   Hereditary hemochromatosis ([place]) 06/24/2020   Mixed incontinence 08/14/2019   Anxiety 06/11/2019   Elevated C-reactive protein 06/11/2019   Exposure to mold 06/11/2019   Vitamin B12 deficiency (non anemic) 06/11/2019   Vitamin D deficiency 06/11/2019   Aching pain 04/01/2019   Headache 04/01/2019  Pelvic floor dysfunction 04/01/2019   Restless legs 04/01/2019   Dizziness 03/04/2019   Insomnia 03/04/2019   Tinnitus 03/04/2019   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 SERVICES [address] [place], [place], [address] Phone: [phone]   Fax:  [phone]  Name: CORIANA ANGELLO MRN: [id number] Date of Birth: 14-Nov-1945

## 2021-08-28 NOTE — Patient Instructions (Signed)
Lying on back, knees bent    band under ballmounds  while laying on back w/ knees bent  "W" exercise  20 reps    Band is placed under feet, knees bent, feet are hip width apart Hold band with thumbs point out, keep upper arm and elbow touching the bed the whole time  - inhale and then exhale pull bands by bending elbows hands move in a "w"  (feel shoulder blades squeezing)   ____   Do both sides with open book and winging

## 2021-08-30 ENCOUNTER — Ambulatory Visit: Payer: Federal, State, Local not specified - PPO | Admitting: Podiatry

## 2021-09-04 ENCOUNTER — Other Ambulatory Visit: Payer: Self-pay

## 2021-09-04 ENCOUNTER — Ambulatory Visit (INDEPENDENT_AMBULATORY_CARE_PROVIDER_SITE_OTHER): Payer: Medicare Other | Admitting: Podiatry

## 2021-09-04 ENCOUNTER — Encounter: Payer: Self-pay | Admitting: Podiatry

## 2021-09-04 DIAGNOSIS — L603 Nail dystrophy: Secondary | ICD-10-CM | POA: Diagnosis not present

## 2021-09-04 NOTE — Progress Notes (Signed)
She presents today for follow-up of her nail fungus she is completed her laser therapy stating that she has been 8 times for laser and states that is looking very good still a little bit dark with some discoloration as she points to the tibial border of the hallux left.  Objective: Vital signs are stable alert oriented x3.  Pulses are palpable.  There is no erythema edema cellulitis drainage or odor.  The nail does demonstrate some discoloration with a mild nail dystrophy but looks much improved from previous evaluations.  Assessment: Well-healing onychomycosis.  Plan: I will recommend that she continue laser therapy every other month until grown out.

## 2021-09-07 ENCOUNTER — Encounter: Payer: Medicare Other | Admitting: Physical Therapy

## 2021-09-11 ENCOUNTER — Other Ambulatory Visit: Payer: Self-pay

## 2021-09-11 ENCOUNTER — Ambulatory Visit: Payer: Medicare Other | Admitting: Physical Therapy

## 2021-09-11 DIAGNOSIS — M533 Sacrococcygeal disorders, not elsewhere classified: Secondary | ICD-10-CM

## 2021-09-11 DIAGNOSIS — G8929 Other chronic pain: Secondary | ICD-10-CM

## 2021-09-11 DIAGNOSIS — M25551 Pain in right hip: Secondary | ICD-10-CM | POA: Diagnosis not present

## 2021-09-11 DIAGNOSIS — M25651 Stiffness of right hip, not elsewhere classified: Secondary | ICD-10-CM

## 2021-09-11 DIAGNOSIS — M545 Low back pain, unspecified: Secondary | ICD-10-CM

## 2021-09-11 DIAGNOSIS — R2689 Other abnormalities of gait and mobility: Secondary | ICD-10-CM

## 2021-09-11 NOTE — Patient Instructions (Signed)
   Side of hip stretch:  Reclined twist for hips and side of the hips/ legs  Lay on your back, knees bend Scoot hips to the R , leave shoulders in place [place] side to side   knees to the L side   Head to the R side  Also, rest for 5 -10 min,  resting onto pillows to keep leg at the same width of hips Pillow under L thigh to minimize too much strain  __  Figure stretch with sliding L heel forward and back whil on your back   10 reps   ___  Seated figure stretch with twist to to R  5 reps  Hi five the sky , elbow up  ( to stretch the axillary space)  5 reps

## 2021-09-11 NOTE — Therapy (Signed)
[place] MAIN [facility] SERVICES [address] [place], [place], [address] Phone: [phone]   Fax:  [phone]  Physical Therapy Treatment  Patient Details  Name: Dorothy Turner MRN: [id number] Date of Birth: Aug 15, 1946 Referring Provider (PT): Wannetta Sender   Encounter Date: 09/11/2021   PT End of Session - 09/11/21 1402     Visit Number 6    Number of Visits 10    Date for PT Re-Evaluation 10/03/21    PT Start Time 1300    PT Stop Time 1358    PT Time Calculation (min) 58 min    Activity Tolerance Patient tolerated treatment well    Behavior During Therapy [facility] for tasks assessed/performed             Past Medical History:  Diagnosis Date   Atrophic vaginitis    Breast cancer ([place]) 2001   right breast ca with lumpectomy and rad tx.    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  Stress   Hemochromatosis    Hyperlipidemia    IBS (irritable bowel syndrome)    Malignant neoplasm of breast ([place])    Paget disease, extra-mammary    Personal history of radiation therapy 2001   BREAST CA   Plantar fibromatosis    Tinnitus    Vertigo    several months ago    Past Surgical History:  Procedure Laterality Date   ANTERIOR AND POSTERIOR VAGINAL REPAIR  09/14/2019   [facility]   BASAL CELL CARCINOMA EXCISION     BLADDER SUSPENSION  09/14/2019   [facility]   BREAST BIOPSY Left 2001   benign, marked placed   BREAST BIOPSY Right 2001   positive   BREAST LUMPECTOMY Right    2001 with radiation   CATARACT EXTRACTION W/PHACO Left 03/15/2020   Procedure: CATARACT EXTRACTION PHACO AND INTRAOCULAR LENS PLACEMENT ([place]) LEFT 10.91 01:01.4;  Surgeon: Birder Robson, MD;  Location: [place];  Service: Ophthalmology;  Laterality: Left;   COLONOSCOPY WITH PROPOFOL N/A 06/10/2020   Procedure: COLONOSCOPY WITH PROPOFOL;  Surgeon: Lesly Rubenstein, MD;  Location: [facility]  ENDOSCOPY;  Service: Endoscopy;  Laterality: N/A;   left eye     reconstructive from skin cancer   MASTECTOMY Right 2001   partial   TOTAL KNEE ARTHROPLASTY Left 02/21/2021   Procedure: TOTAL KNEE ARTHROPLASTY;  Surgeon: Corky Mull, MD;  Location: [facility] ORS;  Service: Orthopedics;  Laterality: Left;   VAGINAL HYSTERECTOMY  09/14/2019   [facility]   VULVECTOMY PARTIAL  2019    There were no vitals filed for this visit.   Subjective Assessment - 09/11/21 1304     Subjective Today her R hip pain is not too bad . 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 L TKA 2022. Gynceology Hx: episiotomy with 3 vaginal deliveries.    Patient Stated Goals get in shape to avoid surgery                [facility] PT Assessment - 09/11/21 1306       AROM   Overall AROM Comments figure-4 sitting RLE 68 cm lat plateau to floor, (  post Tx: 65 cm) ,   60 cm L      Palpation   SI assessment  fascial restrictions  along R axilla 2/2 partial mastectomy scar adhesion,  R paraspinal tightness/ R SIJ hypomobility limited in FADDIR ( post Tx: increased FADDIR )                           [facility] Adult PT Treatment/Exercise - 09/11/21 1403       Neuro Re-ed    Neuro Re-ed Details  cued for R SIJ stretches / paraspinal      Manual Therapy   Manual therapy comments STM/MWM at problem areas noted in assessment to promote mobility SIJ                       PT Short Term Goals - 07/26/21 1756       PT SHORT TERM GOAL #1   Title Pt will be independent with HEP in order to improve continence and mobility    Time 4    Period Weeks    Status New    Target Date 08/23/21               PT Long Term Goals - 09/11/21 1307        PT LONG TERM GOAL #1   Title Pt will demo proper body mechanics for housecleaning, gardening, fitness routine  to minimize straining/ worsening of pelvic floor adysfunctions and prolapse.    Time 4    Period Weeks    Status On-going      PT LONG TERM GOAL #2   Title Pt will report complete emptying of bladder and not need to return a second time to the toilet across 1 week in order edemo improved pelvic floor coordination and strengthening    Time 6    Period Weeks    Status On-going      PT LONG TERM GOAL #3   Title Pt will demo improved scores of FOTO Urinary from 61 pts to > 71 pts, Prolapse from 8 pts to < 4 pts  in order to improve incontinence and QOL    Time 10    Period Weeks    Status On-going      PT LONG TERM GOAL #4   Title Pt will report being able to sit for a 30 min and then not need to wait to start walking and also decreased pain of 50% with lying on R side to demo improved R hip pain and return to ADLs    Baseline not able to    Time 8    Period Weeks    Status On-going      PT LONG TERM GOAL #5   Title Pt will demo levelled pelvic girdle and shoulder across 2 weeks in order to progress to deep core strengthening and pelvic floor coordination    Baseline L shoulder, hip higher, R  thoracic, L lumbar convex curve    Time 2    Period Weeks    Status Achieved      Additional Long Term Goals   Additional Long Term Goals Yes      PT LONG TERM GOAL #6   Title Pt will demo increased gait speed from 1.04 m/s to > 1.10 m/s and less genu valgus on L  and report decreased R hip pain to improve aerobic health    Time 8  Period Weeks    Status On-going      PT LONG TERM GOAL #7   Title Pt will demo decreased scar adhesions from Lumpectomy, partial mastectomy on R with completion of tomaxifen and radiation 2001, bladder sling procedure with hysterectomy 2020, Vulvectomy partialy L in order to optimize mobility    Time 8    Period Weeks    Status On-going       PT LONG TERM GOAL #8   Title Pt will report no more radiating pain from R hip to knee when navigating stairs with single UE support    Time 8    Period Weeks    Status On-going      PT LONG TERM GOAL  #9   TITLE Pt will demo proper technique and alignment with fitness and gym routine to mimimize worsening of prolapse    Time 6    Period Weeks    Status On-going      PT LONG TERM GOAL  #10   TITLE Pt will demo increased R hip AROM in figure sitting in order to sit in this position as another option when sitting for long periods    Baseline figure-4 sitting RLE 68 cm lat plateau to floor, 60 cm L    Time 10    Period Weeks    Status New    Target Date 11/20/21                   Plan - 09/11/21 1402     Clinical Impression Statement Continued to address spinal asymmetries  and R SIJ hypomobility, and R thoracic/lumbar spinal mm tensions and fascial restrictions at axillary space 2/2 partial mastectomy. Pt demo'd increased hip mobility on R and able to perform figure -4 stretch with more hip abduction/ER. Plan to add deep core coordination and strengthening and assess  pelvic floor at upcoming sessions now that pt demonstrates more pelvic and spinal alignment. Pt continues to benefit from skilled PT   Personal Factors and Comorbidities Age;Comorbidity 1;Comorbidity 2;Fitness;Past/Current Experience;Sex    Comorbidities 09/14/2019  Anterior and posterior vaginal repair   09/14/2019  Bladder suspension   09/14/2019  Vaginal hysterectomy   2019  Vulvectomy partial  2001  Breast biopsy (Left)   2001  Breast biopsy (Right)   2001 lumpectomy  (Right)   Date Unknown  Basal cell carcinoma excision  Date Unknown  Breast lumpectomy (Right)    Examination-Activity Limitations Continence;Other    Stability/Clinical Decision Making Evolving/Moderate complexity    Rehab Potential Good    PT Frequency 1x / week    PT Duration Other (comment)   10   PT Treatment/Interventions ADLs/Self Care  Home Management;Electrical Stimulation;Therapeutic activities;Patient/family education;Spinal Manipulations;Joint Manipulations;Passive range of motion;Neuromuscular re-education;Manual techniques;Functional mobility training;Cryotherapy;Ultrasound;Moist Heat;DME Instruction;Iontophoresis 4mg /ml Dexamethasone;Gait training;Stair training;Traction;Balance training;Therapeutic exercise;Biofeedback;Manual lymph drainage;Energy conservation;Splinting;Taping;Scar mobilization    Consulted and Agree with Plan of Care Patient             Patient will benefit from skilled therapeutic intervention in order to improve the following deficits and impairments:  Abnormal gait, Decreased balance, Impaired flexibility, Difficulty walking, Decreased endurance, Decreased activity tolerance, Decreased strength, Decreased range of motion, Postural dysfunction, Impaired tone, Increased muscle spasms, Decreased mobility, Increased fascial restricitons, Improper body mechanics, Pain, Decreased coordination, Hypomobility, Decreased safety awareness  Visit Diagnosis: Pain in right hip  Sacrococcygeal disorders, not elsewhere classified  Other abnormalities of gait and mobility  Stiffness of right hip, not elsewhere classified  Chronic right-sided low  back pain without sciatica     Problem List Patient Active Problem List   Diagnosis Date Noted   Total knee replacement status, left 02/21/2021   Status post total knee replacement using cement, left 02/21/2021   Hereditary hemochromatosis ([place]) 06/24/2020   Mixed incontinence 08/14/2019   Anxiety 06/11/2019   Elevated C-reactive protein 06/11/2019   Exposure to mold 06/11/2019   Vitamin B12 deficiency (non anemic) 06/11/2019   Vitamin D deficiency 06/11/2019   Aching pain 04/01/2019   Headache 04/01/2019   Pelvic floor dysfunction 04/01/2019   Restless legs 04/01/2019   Dizziness 03/04/2019   Insomnia 03/04/2019   Tinnitus 03/04/2019   Paget's  disease of vulva ([place]) 04/21/2018   Abdominal bloating 02/20/2018   Dysuria 02/20/2018   Uterine prolapse 02/20/2018   Primary osteoarthritis of left knee 03/25/2017   Heart palpitations 02/09/2016   Gastro-esophageal reflux disease without esophagitis 04/18/2015   Hyperlipidemia 04/18/2015   Irritable bowel syndrome without diarrhea 04/18/2015   Atrophic vaginitis 04/18/2015   Malignant neoplasm of breast ([place]) 04/18/2015   Plantar fascial fibromatosis 04/18/2015   ANA positive 94/71/2527   Lichen 12/92/9090    Jerl Mina, PT 09/11/2021, 2:04 PM  [place] MAIN [facility] SERVICES [address] [place], [place], [address] Phone: [phone]   Fax:  [phone]  Name: Dorothy Turner MRN: [id number] Date of Birth: 1946-09-04

## 2021-09-18 ENCOUNTER — Ambulatory Visit: Payer: Medicare Other | Attending: Obstetrics and Gynecology | Admitting: Physical Therapy

## 2021-09-18 ENCOUNTER — Other Ambulatory Visit: Payer: Self-pay

## 2021-09-18 DIAGNOSIS — G8929 Other chronic pain: Secondary | ICD-10-CM | POA: Insufficient documentation

## 2021-09-18 DIAGNOSIS — M545 Low back pain, unspecified: Secondary | ICD-10-CM | POA: Insufficient documentation

## 2021-09-18 DIAGNOSIS — M25551 Pain in right hip: Secondary | ICD-10-CM | POA: Diagnosis present

## 2021-09-18 DIAGNOSIS — M25651 Stiffness of right hip, not elsewhere classified: Secondary | ICD-10-CM | POA: Diagnosis present

## 2021-09-18 DIAGNOSIS — M533 Sacrococcygeal disorders, not elsewhere classified: Secondary | ICD-10-CM | POA: Insufficient documentation

## 2021-09-18 DIAGNOSIS — R2689 Other abnormalities of gait and mobility: Secondary | ICD-10-CM | POA: Insufficient documentation

## 2021-09-18 NOTE — Therapy (Signed)
[place] MAIN [facility] SERVICES [address] [place], [place], [address] Phone: [phone]   Fax:  [phone]  Physical Therapy Treatment  Patient Details  Name: Dorothy Turner MRN: [id number] Date of Birth: 09/19/1946 Referring Provider (PT): Wannetta Sender   Encounter Date: 09/18/2021   PT End of Session - 09/18/21 1308     Visit Number 7    Number of Visits 10    Date for PT Re-Evaluation 10/03/21    PT Start Time 0174    PT Stop Time 1403    PT Time Calculation (min) 58 min    Activity Tolerance Patient tolerated treatment well    Behavior During Therapy [facility] for tasks assessed/performed             Past Medical History:  Diagnosis Date   Atrophic vaginitis    Breast cancer ([place]) 2001   right breast ca with lumpectomy and rad tx.    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  Stress   Hemochromatosis    Hyperlipidemia    IBS (irritable bowel syndrome)    Malignant neoplasm of breast ([place])    Paget disease, extra-mammary    Personal history of radiation therapy 2001   BREAST CA   Plantar fibromatosis    Tinnitus    Vertigo    several months ago    Past Surgical History:  Procedure Laterality Date   ANTERIOR AND POSTERIOR VAGINAL REPAIR  09/14/2019   [facility]   BASAL CELL CARCINOMA EXCISION     BLADDER SUSPENSION  09/14/2019   [facility]   BREAST BIOPSY Left 2001   benign, marked placed   BREAST BIOPSY Right 2001   positive   BREAST LUMPECTOMY Right    2001 with radiation   CATARACT EXTRACTION W/PHACO Left 03/15/2020   Procedure: CATARACT EXTRACTION PHACO AND INTRAOCULAR LENS PLACEMENT ([place]) LEFT 10.91 01:01.4;  Surgeon: Birder Robson, MD;  Location: [place];  Service: Ophthalmology;  Laterality: Left;   COLONOSCOPY WITH PROPOFOL N/A 06/10/2020   Procedure: COLONOSCOPY WITH PROPOFOL;  Surgeon: Lesly Rubenstein, MD;  Location: [facility]  ENDOSCOPY;  Service: Endoscopy;  Laterality: N/A;   left eye     reconstructive from skin cancer   MASTECTOMY Right 2001   partial   TOTAL KNEE ARTHROPLASTY Left 02/21/2021   Procedure: TOTAL KNEE ARTHROPLASTY;  Surgeon: Corky Mull, MD;  Location: [facility];  Service: Orthopedics;  Laterality: Left;   VAGINAL HYSTERECTOMY  09/14/2019   [facility]   VULVECTOMY PARTIAL  2019    There were no vitals filed for this visit.   Subjective Assessment - 09/18/21 1307     Subjective Pt has been doing her exercises 3 x week.  R hip pain was 100% better after last session and since then, it is still less. Crossing the R ankle over L thigh has been easier.    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 L TKA 2022. Gynceology Hx: episiotomy with 3 vaginal deliveries.    Patient Stated Goals get in shape to avoid surgery                [facility] PT Assessment - 09/18/21 1317       Coordination   Coordination  and Movement Description excessive oversue of obliques with deep core, poor activation of pelvic floor      Ambulation/Gait   Gait velocity 1.12 m/s                        Pelvic Floor Special Questions - 09/18/21 1404     Pelvic Floor Internal Exam pt consented verbally without contraindications    Exam Type Vaginal    Palpation tightnes / tenderness at 5 o'clock at 1st-2 nd layer, L ischiocavernosus, lowered bladder slightly exterior pubic symphysis/inside introitus ,    Strength weak squeeze, no lift   post Tx: 3/5 with cues              [facility] Adult PT Treatment/Exercise - 09/18/21 1317       Neuro Re-ed    Neuro Re-ed Details  cued for less oblique overuse with deep core, more TrA co-activation. modified deep core position in gravity eliminated position with pillow under hips       Moist Heat Therapy   Number Minutes Moist Heat 3 Minutes    Moist Heat Location --   perineum,     Manual Therapy   Internal Pelvic Floor STM/MWM at problem areas, modified pressure and location where tendernss was reported                       PT Short Term Goals - 07/26/21 1756       PT SHORT TERM GOAL #1   Title Pt will be independent with HEP in order to improve continence and mobility    Time 4    Period Weeks    Status New    Target Date 08/23/21               PT Long Term Goals - 09/18/21 1310       PT LONG TERM GOAL #1   Title Pt will demo proper body mechanics for housecleaning, gardening, fitness routine  to minimize straining/ worsening of pelvic floor adysfunctions and prolapse.    Time 4    Period Weeks    Status Achieved      PT LONG TERM GOAL #2   Title Pt will report complete emptying of bladder and not need to return a second time to the toilet across 1 week in order edemo improved pelvic floor coordination and strengthening    Time 6    Period Weeks    Status On-going      PT LONG TERM GOAL #3   Title Pt will demo improved scores of FOTO Urinary from 61 pts to > 71 pts, Prolapse from 8 pts to < 4 pts  in order to improve incontinence and QOL    Time 10    Period Weeks    Status On-going      PT LONG TERM GOAL #4   Title Pt will report being able to sit for a 30 min and then not need to wait to start walking and also decreased pain of 50% with lying on R side to demo improved R hip pain and return to ADLs    Baseline not able to    Time 8    Period Weeks    Status Achieved      PT LONG TERM GOAL #5   Title Pt will demo levelled pelvic girdle and shoulder across 2 weeks in order to progress to deep core strengthening and pelvic floor  coordination    Baseline L shoulder, hip higher, R  thoracic, L lumbar convex curve    Time 2    Period Weeks    Status Achieved      PT LONG TERM GOAL #6   Title Pt will demo increased gait  speed from 1.04 m/s to > 1.10 m/s and less genu valgus on L  and report decreased R hip pain to improve aerobic health    Time 8    Period Weeks    Status On-going      PT LONG TERM GOAL #7   Title Pt will demo decreased scar adhesions from Lumpectomy, partial mastectomy on R with completion of tomaxifen and radiation 2001, bladder sling procedure with hysterectomy 2020, Vulvectomy partialy L in order to optimize mobility    Time 8    Period Weeks    Status On-going      PT LONG TERM GOAL #8   Title Pt will report no more radiating pain from R hip to knee when navigating stairs with single UE support    Time 8    Period Weeks    Status Partially Met      PT LONG TERM GOAL  #9   TITLE Pt will demo proper technique and alignment with fitness and gym routine to mimimize worsening of prolapse    Time 6    Period Weeks    Status On-going      PT LONG TERM GOAL  #10   TITLE Pt will demo increased R hip AROM in figure sitting in order to sit in this position as another option when sitting for long periods    Baseline figure-4 sitting RLE 68 cm lat plateau to floor, 60 cm L   ( 09/18/21: 66 cmRLE)    Time 10    Period Weeks    Status Achieved                   Plan - 09/18/21 1403     Clinical Impression Statement Pt demo'd increased L anterior pelvic floor mm tightness with poor coordination of pelvic floor and lowered bladder position. Pt required modified technique for minimizing tenderness and tightness of mm. Pt demo'd improved upward movement of pelvic floor in coordination of deep core post Tx. Continue with pelvic floor training at next session . Pt continues to benefit from skilled PT    Personal Factors and Comorbidities Age;Comorbidity 1;Comorbidity 2;Fitness;Past/Current Experience;Sex    Comorbidities 09/14/2019  Anterior and posterior vaginal repair   09/14/2019  Bladder suspension   09/14/2019  Vaginal hysterectomy   2019  Vulvectomy partial  2001  Breast biopsy  (Left)   2001  Breast biopsy (Right)   2001 lumpectomy  (Right)   Date Unknown  Basal cell carcinoma excision  Date Unknown  Breast lumpectomy (Right)    Examination-Activity Limitations Continence;Other    Stability/Clinical Decision Making Evolving/Moderate complexity    Rehab Potential Good    PT Frequency 1x / week    PT Duration Other (comment)   10   PT Treatment/Interventions ADLs/Self Care Home Management;Electrical Stimulation;Therapeutic activities;Patient/family education;Spinal Manipulations;Joint Manipulations;Passive range of motion;Neuromuscular re-education;Manual techniques;Functional mobility training;Cryotherapy;Ultrasound;Moist Heat;DME Instruction;Iontophoresis 35m/ml Dexamethasone;Gait training;Stair training;Traction;Balance training;Therapeutic exercise;Biofeedback;Manual lymph drainage;Energy conservation;Splinting;Taping;Scar mobilization    Consulted and Agree with Plan of Care Patient             Patient will benefit from skilled therapeutic intervention in order to improve the following deficits and impairments:  Abnormal gait, Decreased  balance, Impaired flexibility, Difficulty walking, Decreased endurance, Decreased activity tolerance, Decreased strength, Decreased range of motion, Postural dysfunction, Impaired tone, Increased muscle spasms, Decreased mobility, Increased fascial restricitons, Improper body mechanics, Pain, Decreased coordination, Hypomobility, Decreased safety awareness  Visit Diagnosis: Other abnormalities of gait and mobility  Pain in right hip  Sacrococcygeal disorders, not elsewhere classified  Stiffness of right hip, not elsewhere classified  Chronic right-sided low back pain without sciatica     Problem List Patient Active Problem List   Diagnosis Date Noted   Total knee replacement status, left 02/21/2021   Status post total knee replacement using cement, left 02/21/2021   Hereditary hemochromatosis ([place]) 06/24/2020   Mixed  incontinence 08/14/2019   Anxiety 06/11/2019   Elevated C-reactive protein 06/11/2019   Exposure to mold 06/11/2019   Vitamin B12 deficiency (non anemic) 06/11/2019   Vitamin D deficiency 06/11/2019   Aching pain 04/01/2019   Headache 04/01/2019   Pelvic floor dysfunction 04/01/2019   Restless legs 04/01/2019   Dizziness 03/04/2019   Insomnia 03/04/2019   Tinnitus 03/04/2019   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[place]) 04/21/2018   Abdominal bloating 02/20/2018   Dysuria 02/20/2018   Uterine prolapse 02/20/2018   Primary osteoarthritis of left knee 03/25/2017   Heart palpitations 02/09/2016   Gastro-esophageal reflux disease without esophagitis 04/18/2015   Hyperlipidemia 04/18/2015   Irritable bowel syndrome without diarrhea 04/18/2015   Atrophic vaginitis 04/18/2015   Malignant neoplasm of breast ([place]) 04/18/2015   Plantar fascial fibromatosis 04/18/2015   ANA positive 14/44/5848   [place] 35/05/5731    Jerl Mina, PT 09/18/2021, 2:47 PM  [place] MAIN [facility] SERVICES [address] [place], [place], [address] Phone: [phone]   Fax:  [phone]  Name: Dorothy Turner MRN: [id number] Date of Birth: [birth year]

## 2021-09-18 NOTE — Patient Instructions (Signed)
Stretch for pelvic floor   V- slides  "v heels slide away and then back toward buttocks and then rock knee to slight ,  slide heel along at 11 o clock away from buttocks   10 reps   __  Elevate hips with pillow under when doing deep core 1-2 to faciliate a more upward position of pelvic organs    Practice proper pelvic floor coordination  Inhale: expand pelvic floor muscles Exhale" "j" scoop, allow pelvic floor to close, lift first before belly sinks   ( not "draw abdominal muscle to spine" or strain with abdominal muscles")

## 2021-09-20 ENCOUNTER — Other Ambulatory Visit: Payer: Medicare Other

## 2021-09-25 ENCOUNTER — Encounter: Payer: Medicare Other | Admitting: Physical Therapy

## 2021-10-02 ENCOUNTER — Other Ambulatory Visit: Payer: Self-pay

## 2021-10-02 ENCOUNTER — Ambulatory Visit: Payer: Medicare Other | Admitting: Physical Therapy

## 2021-10-02 DIAGNOSIS — M25551 Pain in right hip: Secondary | ICD-10-CM

## 2021-10-02 DIAGNOSIS — R2689 Other abnormalities of gait and mobility: Secondary | ICD-10-CM

## 2021-10-02 DIAGNOSIS — M533 Sacrococcygeal disorders, not elsewhere classified: Secondary | ICD-10-CM

## 2021-10-02 DIAGNOSIS — G8929 Other chronic pain: Secondary | ICD-10-CM

## 2021-10-02 DIAGNOSIS — M25651 Stiffness of right hip, not elsewhere classified: Secondary | ICD-10-CM

## 2021-10-02 DIAGNOSIS — M545 Low back pain, unspecified: Secondary | ICD-10-CM

## 2021-10-02 NOTE — Progress Notes (Signed)
[facility] Return Visit  SUBJECTIVE  History of Present Illness: Dorothy Turner is a 75 y.o. female seen in follow-up for prolapse and levator spasm. Plan at last visit was referral to pelvic PT- she has attended several sessions.   PT feels she may have some adhesions related to the vulvectomy. Has started treatment with the Imiquimod cream- three times a week for the Pagets. Concerned that it may cause irritation to the prolapse area.   Urinary hesitancy has not improved yet with PT. Has some bladder urgency as well but denies leakage.   Past Medical History: Patient  has a past medical history of Atrophic vaginitis, Breast cancer ([place]) (2001), Cancer ([place]), GERD (gastroesophageal reflux disease), Headache, Hemochromatosis, Hyperlipidemia, IBS (irritable bowel syndrome), Malignant neoplasm of breast ([place]), Paget disease, extra-mammary, Personal history of radiation therapy (2001), Plantar fibromatosis, Tinnitus, and Vertigo.   Past Surgical History: She  has a past surgical history that includes left eye; Breast biopsy (Left, 2001); Breast biopsy (Right, 2001); Breast lumpectomy (Right); Anterior and posterior vaginal repair (09/14/2019); Bladder suspension (09/14/2019); Vulvectomy partial (2019); Vaginal hysterectomy (09/14/2019); Cataract extraction w/PHACO (Left, 03/15/2020); Excision basal cell carcinoma; Mastectomy (Right, 2001); Colonoscopy with propofol (N/A, 06/10/2020); and Total knee arthroplasty (Left, 02/21/2021).   Medications: She has a current medication list which includes the following prescription(s): acetaminophen, ashwagandha, carboxymethylcellulose sodium, imiquimod, fish oil, OVER THE COUNTER MEDICATION, polyethylene glycol, turmeric, valacyclovir, vitamin b-12, and zolpidem.   Allergies: Patient is allergic to ciprofloxacin, diphenhydramine hcl, and meloxicam.   Social History: Patient  reports that she quit smoking about 27 years ago. Her smoking use  included cigarettes. She has a 20.75 pack-year smoking history. She has never used smokeless tobacco. She reports current alcohol use of about 10.0 standard drinks per week. She reports that she does not use drugs.      OBJECTIVE     Physical Exam: Vitals:   10/03/21 1128  BP: (!) 145/83  Pulse: 86  Weight: 150 lb (68 kg)   Gen: No apparent distress, A&O x 3.  Detailed Urogynecologic Evaluation:  Deferred.    ASSESSMENT AND PLAN    Dorothy Turner is a 75 y.o. with:  1. Urinary urgency   2. Vaginal vault prolapse after hysterectomy    Urinary urgency - we discussed avoidance of common bladder irritants - continue with PT and pelvic floor strengthening. - If desired, can also start medication. She prefers not to at this time.   2. Prolapse - Continue pelvic PT - reviewed option of pessary or surgery. She would like to avoid these currently.  - agree with derm suggestion to use moisture barrier over prolapse to prevent irritation with Imiquimod cream. Suggested vaseline, coconut oil or zinc based diaper rash cream  Return as needed  Jaquita Folds, MD  Time spent: I spent 20 minutes dedicated to the care of this patient on the date of this encounter to include pre-visit review of records, face-to-face time with the patient and post visit documentation.

## 2021-10-02 NOTE — Patient Instructions (Addendum)
PELVIC FLOOR / KEGEL EXERCISES   Pelvic floor/ Kegel exercises are used to strengthen the muscles in the base of your pelvis that are responsible for supporting your pelvic organs and preventing urine/feces leakage. Based on your therapist's recommendations, they can be performed while standing, sitting, or lying down. Imagine pelvic floor area as a diamond with pelvic landmarks: top =pubic bone, bottom tip=tailbone, sides=sitting bones (ischial tuberosities).    Make yourself aware of this muscle group by using these cues while coordinating your breath: Inhale, feel pelvic floor diamond area lower like hammock towards your feet and ribcage/belly expanding. Pause. Let the exhale naturally and feel your belly sink, abdominal muscles hugging in around you and you may notice the pelvic diamond draws upward towards your head forming a umbrella shape. Give a squeeze during the exhalation like you are stopping the flow of urine. If you are squeezing the buttock muscles, try to give 50% less effort.   Common Errors: Breath holding: If you are holding your breath, you may be bearing down against your bladder instead of pulling it up. If you belly bulges up while you are squeezing, you are holding your breath. Be sure to breathe gently in and out while exercising. Counting out loud may help you avoid holding your breath. Accessory muscle use: You should not see or feel other muscle movement when performing pelvic floor exercises. When done properly, no one can tell that you are performing the exercises. Keep the buttocks, belly and inner thighs relaxed. Overdoing it: Your muscles can fatigue and stop working for you if you over-exercise. You may actually leak more or feel soreness at the lower abdomen or rectum.  YOUR HOME EXERCISE PROGRAM  LONG HOLDS:   Position: on back or reclined in car seat ( do not lift head to sit up, instead make sure to use the handle to raise the car seat up and keep spine/head  relaxed to not place load on pelvic floor/ abdominal muscle)    Inhale and then exhale. Then squeeze the muscle and count aloud for 3 seconds. Rest with three long breaths. (Be sure to let belly sink in with exhales and not push outward) Perform 5 repetitions, 3 different times/day  SHORT HOLDS: Position: on  sitting  Inhale and then exhale. Then squeeze the muscle.  (Be sure to let belly sink in with exhales and not push outward) Perform 3 repetitions, 5 different  Times/day                      DECREASE DOWNWARD PRESSURE ON  YOUR PELVIC FLOOR, ABDOMINAL, LOW BACK MUSCLES       PRESERVE YOUR PELVIC HEALTH LONG-TERM   ** SQUEEZE pelvic floor BEFORE YOUR SNEEZE, COUGH, LAUGH   ** EXHALE BEFORE YOU RISE AGAINST GRAVITY (lifting, sit to stand, from squat to stand)   ** LOG ROLL OUT OF BED INSTEAD OF CRUNCH/SIT-UP

## 2021-10-03 ENCOUNTER — Ambulatory Visit (INDEPENDENT_AMBULATORY_CARE_PROVIDER_SITE_OTHER): Payer: Medicare Other | Admitting: Obstetrics and Gynecology

## 2021-10-03 ENCOUNTER — Encounter: Payer: Self-pay | Admitting: Obstetrics and Gynecology

## 2021-10-03 VITALS — BP 145/83 | HR 86 | Wt 150.0 lb

## 2021-10-03 DIAGNOSIS — N993 Prolapse of vaginal vault after hysterectomy: Secondary | ICD-10-CM | POA: Diagnosis not present

## 2021-10-03 DIAGNOSIS — R3915 Urgency of urination: Secondary | ICD-10-CM

## 2021-10-03 NOTE — Therapy (Signed)
[place] MAIN [facility] SERVICES [address] [place], [place], [address] Phone: [phone]   Fax:  [phone]  Physical Therapy Treatment  Patient Details  Name: Dorothy Turner MRN: [id number] Date of Birth: August 28, 1946 Referring Provider (PT): Wannetta Sender   Encounter Date: 10/02/2021   PT End of Session - 10/02/21 1316     Visit Number 8    Number of Visits 10    Date for PT Re-Evaluation 50/09/38   recert 18/29/93   PT Start Time 1304    PT Stop Time 1400    PT Time Calculation (min) 56 min    Activity Tolerance Patient tolerated treatment well    Behavior During Therapy [facility] for tasks assessed/performed             Past Medical History:  Diagnosis Date   Atrophic vaginitis    Breast cancer ([place]) 2001   right breast ca with lumpectomy and rad tx.    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  Stress   Hemochromatosis    Hyperlipidemia    IBS (irritable bowel syndrome)    Malignant neoplasm of breast ([place])    Paget disease, extra-mammary    Personal history of radiation therapy 2001   BREAST CA   Plantar fibromatosis    Tinnitus    Vertigo    several months ago    Past Surgical History:  Procedure Laterality Date   ANTERIOR AND POSTERIOR VAGINAL REPAIR  09/14/2019   [facility]   BASAL CELL CARCINOMA EXCISION     BLADDER SUSPENSION  09/14/2019   [facility]   BREAST BIOPSY Left 2001   benign, marked placed   BREAST BIOPSY Right 2001   positive   BREAST LUMPECTOMY Right    2001 with radiation   CATARACT EXTRACTION W/PHACO Left 03/15/2020   Procedure: CATARACT EXTRACTION PHACO AND INTRAOCULAR LENS PLACEMENT ([place]) LEFT 10.91 01:01.4;  Surgeon: Birder Robson, MD;  Location: [place];  Service: Ophthalmology;  Laterality: Left;   COLONOSCOPY WITH PROPOFOL N/A 06/10/2020   Procedure: COLONOSCOPY WITH PROPOFOL;  Surgeon: Lesly Rubenstein, MD;   Location: [facility];  Service: Endoscopy;  Laterality: N/A;   left eye     reconstructive from skin cancer   MASTECTOMY Right 2001   partial   TOTAL KNEE ARTHROPLASTY Left 02/21/2021   Procedure: TOTAL KNEE ARTHROPLASTY;  Surgeon: Corky Mull, MD;  Location: [facility];  Service: Orthopedics;  Laterality: Left;   VAGINAL HYSTERECTOMY  09/14/2019   [facility]   VULVECTOMY PARTIAL  2019    There were no vitals filed for this visit.   Subjective Assessment - 10/02/21 1309     Subjective Pt has started chemotherapy topical treatment for skin cancer. Pt started the cream on her chest and will  apply to her vulva area starting tonight for 3 x a week for 3 weeks . Pt is concerned about  the cream eroding her vaginal propulsion.  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   Pt no longer has constant R hip pain nor with sit to stand. Pt is going up and down stairs better and has more range of motion in her L knee. Pt feels her legs are much stronger. The only time she feels her R hip pain is wilt crossing her R ankle over her L thigh.  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 L TKA 2022. Gynceology Hx: episiotomy with 3 vaginal deliveries. 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    Patient Stated Goals get in shape to avoid surgery                [facility] PT Assessment - 10/03/21 1055       Ambulation/Gait   Gait velocity 1.27 m/s                        Pelvic Floor Special Questions - 10/03/21 1106     Palpation seated quick contractions, 3 reps, endurance contraction  hooklying 3 sec, 5 reps    Strength fair squeeze, definite lift    Strength # of reps 5    Strength # of  seconds 3   hooklying              [facility] Adult PT Treatment/Exercise - 10/03/21 1055       Therapeutic Activites    Other Therapeutic Activities explained kegel program for supporting prolapse      Neuro Re-ed    Neuro Re-ed Details  cued for endurance contractions of pelvic floor,                       PT Short Term Goals - 07/26/21 1756       PT SHORT TERM GOAL #1   Title Pt will be independent with HEP in order to improve continence and mobility    Time 4    Period Weeks    Status New    Target Date 08/23/21               PT Long Term Goals - 10/02/21 1317       PT LONG TERM GOAL #1   Title Pt will demo proper body mechanics for housecleaning, gardening, fitness routine  to minimize straining/ worsening of pelvic floor dysfunctions and prolapse.    Time 4    Period Weeks    Status Achieved      PT LONG TERM GOAL #2   Title Pt will report complete emptying of bladder and not need to return a second time to the toilet across 1 week in order demo improved pelvic floor coordination and strengthening    Time 6    Period Weeks    Status On-going      PT LONG TERM GOAL #3   Title Pt will demo improved scores of FOTO Urinary from 61 pts to > 71 pts, Prolapse from 8 pts to < 4 pts  in order to improve incontinence and QOL    Time 10    Period Weeks    Status On-going      PT LONG TERM GOAL #4   Title Pt will report being able to sit for a 30 min and then not need to wait to start walking and also decreased pain of 50% with lying on R side to demo improved R hip pain and return to ADLs    Baseline not able to    Time 8    Period Weeks    Status Achieved      PT LONG TERM GOAL #5   Title Pt will demo  levelled pelvic girdle and shoulder across 2 weeks in order to progress to deep core strengthening and pelvic floor coordination    Baseline L shoulder, hip higher, R  thoracic, L lumbar convex curve    Time 2    Period Weeks    Status Achieved       PT LONG TERM GOAL #6   Title Pt will demo increased gait speed from 1.04 m/s to > 1.10 m/s and less genu valgus on L  and report decreased R hip pain to improve aerobic health  ( 10/02/21: 1.27 m/s)    Time 8    Period Weeks    Status Achieved      PT LONG TERM GOAL #7   Title Pt will demo decreased scar adhesions from Lumpectomy, partial mastectomy on R with completion of tomaxifen and radiation 2001, bladder sling procedure with hysterectomy 2020, Vulvectomy partialy L in order to optimize mobility    Time 8    Period Weeks    Status Achieved      PT LONG TERM GOAL #8   Title Pt will report no more radiating pain from R hip to knee when navigating stairs with single UE support    Time 8    Period Weeks    Status Achieved      PT LONG TERM GOAL  #9   TITLE Pt will demo proper technique and alignment with fitness and gym routine to mimimize worsening of prolapse    Time 6    Period Weeks    Status Partially Met      PT LONG TERM GOAL  #10   TITLE Pt will demo increased R hip AROM in figure sitting in order to sit in this position as another option when sitting for long periods    Baseline figure-4 sitting RLE 68 cm lat plateau to floor, 60 cm L   ( 09/18/21, 10/02/21 : 66 cmRLE)    Time 10    Period Weeks    Status Achieved                   Plan - 10/02/21 1317     Clinical Impression Statement   Pt no longer has constant R hip pain nor with sit to stand. Pt is going up and down stairs better and has more range of motion in her L knee. Pt feels her legs are much stronger. The only time she feels her R hip pain is wilt crossing her R ankle over her L thigh.   Today, progressed pt to pelvic floor contractions, endurance contractions in hooklying, quick contractions in seated position. Anticipate this strengthening will help with prolapse. Pt shoulder proper coordination with minimal cues.  Pt will be applying a chemotherapy topical treatment for skin cancer. Pt  started the cream on her chest and will  apply to her vulva area starting tonight for 3 x a week for 3 weeks . At upcoming sessions, plan to withhold on manual Tx to pelvic floor externally and internally for skin integrity considerations with her chemotherapy cream Tx.   Pt continues to benefit from skilled PT     Personal Factors and Comorbidities Age;Comorbidity 1;Comorbidity 2;Fitness;Past/Current Experience;Sex    Comorbidities 09/14/2019  Anterior and posterior vaginal repair   09/14/2019  Bladder suspension   09/14/2019  Vaginal hysterectomy   2019  Vulvectomy partial  2001  Breast biopsy (Left)   2001  Breast biopsy (Right)   2001 lumpectomy  (Right)  Date Unknown  Basal cell carcinoma excision  Date Unknown  Breast lumpectomy (Right)    Examination-Activity Limitations Continence;Other    Stability/Clinical Decision Making Evolving/Moderate complexity    Rehab Potential Good    PT Frequency 1x / week    PT Duration Other (comment)   10   PT Treatment/Interventions ADLs/Self Care Home Management;Electrical Stimulation;Therapeutic activities;Patient/family education;Spinal Manipulations;Joint Manipulations;Passive range of motion;Neuromuscular re-education;Manual techniques;Functional mobility training;Cryotherapy;Ultrasound;Moist Heat;DME Instruction;Iontophoresis 12m/ml Dexamethasone;Gait training;Stair training;Traction;Balance training;Therapeutic exercise;Biofeedback;Manual lymph drainage;Energy conservation;Splinting;Taping;Scar mobilization    Consulted and Agree with Plan of Care Patient             Patient will benefit from skilled therapeutic intervention in order to improve the following deficits and impairments:  Abnormal gait, Decreased balance, Impaired flexibility, Difficulty walking, Decreased endurance, Decreased activity tolerance, Decreased strength, Decreased range of motion, Postural dysfunction, Impaired tone, Increased muscle spasms, Decreased mobility, Increased  fascial restricitons, Improper body mechanics, Pain, Decreased coordination, Hypomobility, Decreased safety awareness  Visit Diagnosis: Pain in right hip  Sacrococcygeal disorders, not elsewhere classified  Other abnormalities of gait and mobility  Stiffness of right hip, not elsewhere classified  Chronic right-sided low back pain without sciatica     Problem List Patient Active Problem List   Diagnosis Date Noted   Total knee replacement status, left 02/21/2021   Status post total knee replacement using cement, left 02/21/2021   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   Mixed incontinence 08/14/2019   Anxiety 06/11/2019   Elevated C-reactive protein 06/11/2019   Exposure to mold 06/11/2019   Vitamin B12 deficiency (non anemic) 06/11/2019   Vitamin D deficiency 06/11/2019   Aching pain 04/01/2019   Headache 04/01/2019   Pelvic floor dysfunction 04/01/2019   Restless legs 04/01/2019   Dizziness 03/04/2019   Insomnia 03/04/2019   Tinnitus 03/04/2019   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[place] 04/21/2018   Abdominal bloating 02/20/2018   Dysuria 02/20/2018   Uterine prolapse 02/20/2018   Primary osteoarthritis of left knee 03/25/2017   Heart palpitations 02/09/2016   Gastro-esophageal reflux disease without esophagitis 04/18/2015   Hyperlipidemia 04/18/2015   Irritable bowel syndrome without diarrhea 04/18/2015   Atrophic vaginitis 04/18/2015   Malign[place] of breast ([place] 04/18/2015   Plantar fascial fibromatosis 04/18/2015   ANA positive 015/37/9432  Lichen 076/14/7092   YJerl Mina PT 10/03/2021, 11:09 AM  [place] [address]Phone: [phone]  Fax:  [phone] Name: Dorothy REINARDMRN: [id number] of Birth: [birth year]

## 2021-10-03 NOTE — Patient Instructions (Signed)

## 2021-10-09 ENCOUNTER — Encounter: Payer: Medicare Other | Admitting: Physical Therapy

## 2021-10-16 ENCOUNTER — Encounter: Payer: Medicare Other | Admitting: Physical Therapy

## 2021-10-20 ENCOUNTER — Ambulatory Visit (INDEPENDENT_AMBULATORY_CARE_PROVIDER_SITE_OTHER): Payer: Medicare Other | Admitting: *Deleted

## 2021-10-20 ENCOUNTER — Other Ambulatory Visit: Payer: Self-pay

## 2021-10-20 DIAGNOSIS — L603 Nail dystrophy: Secondary | ICD-10-CM

## 2021-10-20 DIAGNOSIS — B351 Tinea unguium: Secondary | ICD-10-CM

## 2021-10-20 NOTE — Progress Notes (Signed)
Patient presents today for the 7th laser treatment. Diagnosed with mycotic nail infection by Dr. Milinda Pointer.   Toenail most affected hallux left.   Dr. Milinda Pointer saw the patient in October for re-evaluation. He would like for her to do a treatment every 8 weeks until nail has completely grown out.   All other systems are negative.  Nails were filed thin. Laser therapy was administered to 1st toenails left and patient tolerated the treatment well. All safety precautions were in place.   Follow up in 8 weeks for laser #8.

## 2021-10-23 ENCOUNTER — Ambulatory Visit: Payer: Medicare Other | Admitting: Physical Therapy

## 2021-10-30 ENCOUNTER — Encounter: Payer: Medicare Other | Admitting: Physical Therapy

## 2021-11-07 ENCOUNTER — Other Ambulatory Visit: Payer: Self-pay

## 2021-11-07 ENCOUNTER — Ambulatory Visit: Payer: Medicare Other | Attending: Obstetrics and Gynecology | Admitting: Physical Therapy

## 2021-11-07 DIAGNOSIS — G8929 Other chronic pain: Secondary | ICD-10-CM | POA: Insufficient documentation

## 2021-11-07 DIAGNOSIS — M533 Sacrococcygeal disorders, not elsewhere classified: Secondary | ICD-10-CM | POA: Insufficient documentation

## 2021-11-07 DIAGNOSIS — M25551 Pain in right hip: Secondary | ICD-10-CM | POA: Diagnosis present

## 2021-11-07 DIAGNOSIS — M25651 Stiffness of right hip, not elsewhere classified: Secondary | ICD-10-CM | POA: Insufficient documentation

## 2021-11-07 DIAGNOSIS — R2689 Other abnormalities of gait and mobility: Secondary | ICD-10-CM | POA: Diagnosis not present

## 2021-11-07 DIAGNOSIS — M545 Low back pain, unspecified: Secondary | ICD-10-CM | POA: Diagnosis present

## 2021-11-07 NOTE — Therapy (Signed)
[place] MAIN [facility] SERVICES [address] [place], [place], [address] Phone: [phone]   Fax:  [phone]  Physical Therapy Treatment / Discharge Summary  from 9/13 to 11/07/21 across 9 visits   Patient Details  Name: Dorothy Turner MRN: [id number] Date of Birth: Mar 23, 1946 Referring Provider (PT): Wannetta Sender   Encounter Date: 11/07/2021   PT End of Session - 11/07/21 1312     Visit Number 9    Number of Visits 10    Date for PT Re-Evaluation 20/10/07   recert 10/30/74   PT Start Time 1304    PT Stop Time 1350    PT Time Calculation (min) 46 min    Activity Tolerance Patient tolerated treatment well    Behavior During Therapy [facility] for tasks assessed/performed             Past Medical History:  Diagnosis Date   Atrophic vaginitis    Breast cancer ([place]) 2001   right breast ca with lumpectomy and rad tx.    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  Stress   Hemochromatosis    Hyperlipidemia    IBS (irritable bowel syndrome)    Malignant neoplasm of breast ([place])    Paget disease, extra-mammary    Personal history of radiation therapy 2001   BREAST CA   Plantar fibromatosis    Tinnitus    Vertigo    several months ago    Past Surgical History:  Procedure Laterality Date   ANTERIOR AND POSTERIOR VAGINAL REPAIR  09/14/2019   [facility]   BASAL CELL CARCINOMA EXCISION     BLADDER SUSPENSION  09/14/2019   [facility]   BREAST BIOPSY Left 2001   benign, marked placed   BREAST BIOPSY Right 2001   positive   BREAST LUMPECTOMY Right    2001 with radiation   CATARACT EXTRACTION W/PHACO Left 03/15/2020   Procedure: CATARACT EXTRACTION PHACO AND INTRAOCULAR LENS PLACEMENT ([place]) LEFT 10.91 01:01.4;  Surgeon: Birder Robson, MD;  Location: [place];  Service: Ophthalmology;  Laterality: Left;   COLONOSCOPY WITH PROPOFOL N/A 06/10/2020   Procedure:  COLONOSCOPY WITH PROPOFOL;  Surgeon: Lesly Rubenstein, MD;  Location: [facility];  Service: Endoscopy;  Laterality: N/A;   left eye     reconstructive from skin cancer   MASTECTOMY Right 2001   partial   TOTAL KNEE ARTHROPLASTY Left 02/21/2021   Procedure: TOTAL KNEE ARTHROPLASTY;  Surgeon: Corky Mull, MD;  Location: [facility];  Service: Orthopedics;  Laterality: Left;   VAGINAL HYSTERECTOMY  09/14/2019   [facility]   VULVECTOMY PARTIAL  2019    There were no vitals filed for this visit.   Subjective Assessment - 11/07/21 1308     Subjective Pt has completed her chemotherapy for extra mammary Paget's disease at her vulva area. Pt  found a dermatologist to work with her skin issue.                    Pt was not able to do her exercises as much. 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 L TKA 2022. Gynceology Hx: episiotomy with 3 vaginal deliveries. Pt has extra-mammary  pagets disease in her vulva area and is being treated by a dermatologist.    Patient Stated Goals get in shape to avoid surgery                [facility] PT Assessment - 11/07/21 1335       Strength   Overall Strength Comments R hip abd 3+/5, L 4/5                        Pelvic Floor Special Questions - 11/07/21 1346     Palpation seated quick contractions, 3 reps, endurance contraction  hooklying 3 sec, 5 reps    Strength fair squeeze, definite lift    Strength # of reps 5    Strength # of seconds 3               [facility] Adult PT Treatment/Exercise - 11/07/21 1352       Therapeutic Activites    Other Therapeutic Activities discussed goals, d/c, continuation of kegels, stretching before/ after  treadmil walking      Neuro Re-ed    Neuro Re-ed Details  cued for HEP                        PT Short Term Goals - 07/26/21 1756       PT SHORT TERM GOAL #1   Title Pt will be independent with HEP in order to improve continence and mobility    Time 4    Period Weeks    Status New    Target Date 08/23/21               PT Long Term Goals - 11/07/21 1312       PT LONG TERM GOAL #1   Title Pt will demo proper body mechanics for housecleaning, gardening, fitness routine  to minimize straining/ worsening of pelvic floor dysfunctions and prolapse.    Time 4    Period Weeks    Status Achieved      PT LONG TERM GOAL #2   Title Pt will report complete emptying of bladder and not need to return a second time to the toilet across 1 week in order demo improved pelvic floor coordination and strengthening    Time 6    Period Weeks    Status Achieved      PT LONG TERM GOAL #3   Title Pt will demo improved scores of FOTO Urinary from 61 pts to > 71 pts, Prolapse from 8 pts to < 4 pts  in order to improve incontinence and QOL    Baseline Urinary 61 pts to 68 pts, Prolapse for  8 pts to 4 pts    Time 10    Period Weeks    Status Partially Met      PT LONG TERM GOAL #4   Title Pt will report being able to sit for a 30 min and then not need to wait to start walking and also decreased pain of 50% with lying on R side to demo improved R hip pain and return to ADLs    Baseline not able to    Time 8    Period Weeks    Status Achieved      PT LONG TERM GOAL #5   Title Pt will demo levelled pelvic girdle and shoulder across 2 weeks in order to progress to deep core strengthening and pelvic floor coordination  Baseline L shoulder, hip higher, R  thoracic, L lumbar convex curve    Time 2    Period Weeks    Status Achieved      PT LONG TERM GOAL #6   Title Pt will demo increased gait speed from 1.04 m/s to > 1.10 m/s and less genu valgus on L  and report decreased R hip pain to improve aerobic health  ( 10/02/21: 1.27 m/s)    Time 8    Period  Weeks    Status Achieved      PT LONG TERM GOAL #7   Title Pt will demo decreased scar adhesions from Lumpectomy, partial mastectomy on R with completion of tomaxifen and radiation 2001, bladder sling procedure with hysterectomy 2020, Vulvectomy partialy L in order to optimize mobility    Time 8    Period Weeks    Status Achieved      PT LONG TERM GOAL #8   Title Pt will report no more radiating pain from R hip to knee when navigating stairs with single UE support    Time 8    Period Weeks    Status Achieved      PT LONG TERM GOAL  #9   TITLE Pt will demo proper technique and alignment with fitness and gym routine to mimimize worsening of prolapse    Time 6    Period Weeks    Status Achieved      PT LONG TERM GOAL  #10   TITLE Pt will demo increased R hip AROM in figure 4 sitting in order to sit in this position as another option when sitting for long periods    Baseline figure-4 sitting RLE 68 cm lat plateau to floor, 60 cm L   ( 09/18/21, 10/02/21 : 66 cmRLE)    Time 10    Period Weeks    Status Achieved                   Plan - 11/07/21 1312     Clinical Impression Statement Pt has achieved  9/10 goals and partially met remaining goal. Pt's FOTO score for Prolapse and Urinary have improved. Pt demo'd levelled pelvic girdle and spine and contributes to resolved R hip pain, LBP, and no more radiating pain which helps with stairs and sleeping on her side. Pt improved with being able to completely emptying of bladder and not need to return a second time to the toilet.  Pt progressed to seated pelvic floor strengthening with good form and no cues. Discussed continuation of kegel HEP and deep core HEP to continue with optimal pelvic function. Reviewed HEP and pt demo'd proper form to kegels and deep core HEP without cues. Pt is ready for d/c at this time.  Pt will benefit from another referral to Pelvic PT in the future if pelvic issues arise from complications of         extramammary Paget's disease at her vulva area. Pt  found a dermatologist to work with her skin issue and she recently completed chemotherapy in the area.      Personal Factors and Comorbidities Age;Comorbidity 1;Comorbidity 2;Fitness;Past/Current Experience;Sex    Comorbidities 09/14/2019  Anterior and posterior vaginal repair   09/14/2019  Bladder suspension   09/14/2019  Vaginal hysterectomy   2019  Vulvectomy partial  2001  Breast biopsy (Left)   2001  Breast biopsy (Right)   2001 lumpectomy  (Right)   Date Unknown  Basal cell carcinoma excision  Date Unknown  Breast lumpectomy (Right)    Examination-Activity Limitations Continence;Other    Stability/Clinical Decision Making Evolving/Moderate complexity    Rehab Potential Good    PT Frequency 1x / week    PT Duration Other (comment)   10   PT Treatment/Interventions ADLs/Self Care Home Management;Electrical Stimulation;Therapeutic activities;Patient/family education;Spinal Manipulations;Joint Manipulations;Passive range of motion;Neuromuscular re-education;Manual techniques;Functional mobility training;Cryotherapy;Ultrasound;Moist Heat;DME Instruction;Iontophoresis 45m/ml Dexamethasone;Gait training;Stair training;Traction;Balance training;Therapeutic exercise;Biofeedback;Manual lymph drainage;Energy conservation;Splinting;Taping;Scar mobilization    Consulted and Agree with Plan of Care Patient             Patient will benefit from skilled therapeutic intervention in order to improve the following deficits and impairments:  Abnormal gait, Decreased balance, Impaired flexibility, Difficulty walking, Decreased endurance, Decreased activity tolerance, Decreased strength, Decreased range of motion, Postural dysfunction, Impaired tone, Increased muscle spasms, Decreased mobility, Increased fascial restricitons, Improper body mechanics, Pain, Decreased coordination, Hypomobility, Decreased safety awareness  Visit Diagnosis: Other abnormalities  of gait and mobility  Pain in right hip  Stiffness of right hip, not elsewhere classified  Chronic right-sided low back pain without sciatica     Problem List Patient Active Problem List   Diagnosis Date Noted   Total knee replacement status, left 02/21/2021   Status post total knee replacement using cement, left 02/21/2021   Hereditary hemochromatosis ([place] 06/24/2020   Mixed incontinence 08/14/2019   Anxiety 06/11/2019   Elevated C-reactive protein 06/11/2019   Exposure to mold 06/11/2019   Vitamin B12 deficiency (non anemic) 06/11/2019   Vitamin D deficiency 06/11/2019   Aching pain 04/01/2019   Headache 04/01/2019   Pelvic floor dysfunction 04/01/2019   Restless legs 04/01/2019   Dizziness 03/04/2019   Insomnia 03/04/2019   Tinnitus 03/04/2019   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[place] 04/21/2018   Abdominal bloating 02/20/2018   Dysuria 02/20/2018   Uterine prolapse 02/20/2018   Primary osteoarthritis of left knee 03/25/2017   Heart palpitations 02/09/2016   Gastro-esophageal reflux disease without esophagitis 04/18/2015   Hyperlipidemia 04/18/2015   Irritable bowel syndrome without diarrhea 04/18/2015   Atrophic vaginitis 04/18/2015   Malignant [place] of breast ([place] 04/18/2015   Plantar fascial fibromatosis 04/18/2015   ANA positive 005/12/5613  Lichen 048/84/5733   Dorothy Turner PT 11/07/2021, 1:53 PM  [place] [facility]SERVICES [address] [address]Phone: [phone]  Fax:  [phone] Name: JMAUDRY ZEIDANMRN: [id number] of Birth: 8October 24, 1947

## 2021-12-15 ENCOUNTER — Ambulatory Visit (INDEPENDENT_AMBULATORY_CARE_PROVIDER_SITE_OTHER): Payer: Medicare Other

## 2021-12-15 ENCOUNTER — Other Ambulatory Visit: Payer: Self-pay

## 2021-12-15 DIAGNOSIS — B351 Tinea unguium: Secondary | ICD-10-CM

## 2021-12-15 DIAGNOSIS — L603 Nail dystrophy: Secondary | ICD-10-CM

## 2021-12-15 NOTE — Progress Notes (Signed)
Patient presents today for the 8th laser treatment. Diagnosed with mycotic nail infection by Dr. Milinda Pointer.   Toenail most affected hallux left.   Dr. Milinda Pointer saw the patient in October for re-evaluation. He would like for her to do a treatment every 8 weeks until nail has completely grown out.   All other systems are negative.  Nails were filed thin. Laser therapy was administered to 1st toenails left and patient tolerated the treatment well. All safety precautions were in place.   Follow up in 8 weeks for laser #9.

## 2022-02-09 ENCOUNTER — Ambulatory Visit (INDEPENDENT_AMBULATORY_CARE_PROVIDER_SITE_OTHER): Payer: Self-pay

## 2022-02-09 DIAGNOSIS — B351 Tinea unguium: Secondary | ICD-10-CM

## 2022-02-09 DIAGNOSIS — L603 Nail dystrophy: Secondary | ICD-10-CM

## 2022-02-09 NOTE — Progress Notes (Signed)
Patient presents today for the 8th laser treatment. Diagnosed with mycotic nail infection by Dr. Milinda Pointer.  ? ?Toenail most affected hallux left.  ? ?Dr. Milinda Pointer saw the patient in October for re-evaluation. He would like for her to do a treatment every 8 weeks until nail has completely grown out.  ? ?All other systems are negative. ? ?Nails were filed thin. Laser therapy was administered to 1st toenails left and patient tolerated the treatment well. All safety precautions were in place.  ? ?Follow up in 8 weeks for laser #9. ? ? ? ?

## 2022-03-12 ENCOUNTER — Encounter: Payer: Self-pay | Admitting: Ophthalmology

## 2022-03-15 NOTE — Discharge Instructions (Signed)

## 2022-03-20 ENCOUNTER — Ambulatory Visit: Payer: Medicare Other | Admitting: Anesthesiology

## 2022-03-20 ENCOUNTER — Ambulatory Visit
Admission: RE | Admit: 2022-03-20 | Discharge: 2022-03-20 | Disposition: A | Discharge status: Home / Self Care | Payer: Medicare Other | Attending: Ophthalmology | Admitting: Ophthalmology

## 2022-03-20 ENCOUNTER — Other Ambulatory Visit: Payer: Self-pay

## 2022-03-20 ENCOUNTER — Encounter
Admission: RE | Disposition: A | Discharge status: Home / Self Care | Payer: Self-pay | Source: Home / Self Care | Attending: Ophthalmology

## 2022-03-20 ENCOUNTER — Encounter: Payer: Self-pay | Admitting: Ophthalmology

## 2022-03-20 DIAGNOSIS — H2511 Age-related nuclear cataract, right eye: Secondary | ICD-10-CM | POA: Diagnosis not present

## 2022-03-20 DIAGNOSIS — Z87891 Personal history of nicotine dependence: Secondary | ICD-10-CM | POA: Diagnosis not present

## 2022-03-20 HISTORY — PX: CATARACT EXTRACTION W/PHACO: SHX586

## 2022-03-20 SURGERY — PHACOEMULSIFICATION, CATARACT, WITH IOL INSERTION
Anesthesia: Monitor Anesthesia Care | Site: Eye | Laterality: Right

## 2022-03-20 MED ORDER — OXYCODONE HCL 5 MG PO TABS: 5.0000 mg | ORAL_TABLET | Freq: Once | ORAL | Status: DC | PRN

## 2022-03-20 MED ORDER — SIGHTPATH DOSE#1 BSS IO SOLN
INTRAOCULAR | Status: DC | PRN
  Administered 2022-03-20: 1 mL via INTRAMUSCULAR

## 2022-03-20 MED ORDER — SIGHTPATH DOSE#1 NA CHONDROIT SULF-NA HYALURON 40-17 MG/ML IO SOLN
INTRAOCULAR | Status: DC | PRN
Start: 2022-03-20 — End: 2022-03-20
  Administered 2022-03-20: 1 mL via INTRAOCULAR

## 2022-03-20 MED ORDER — SIGHTPATH DOSE#1 BSS IO SOLN
INTRAOCULAR | Status: DC | PRN
  Administered 2022-03-20: 50 mL via OPHTHALMIC

## 2022-03-20 MED ORDER — OXYCODONE HCL 5 MG/5ML PO SOLN: 5.0000 mg | Freq: Once | ORAL | Status: DC | PRN

## 2022-03-20 MED ORDER — MOXIFLOXACIN HCL 0.5 % OP SOLN
OPHTHALMIC | Status: DC | PRN
  Administered 2022-03-20: 0.2 mL via OPHTHALMIC

## 2022-03-20 MED ORDER — FENTANYL CITRATE (PF) 100 MCG/2ML IJ SOLN
INTRAMUSCULAR | Status: DC | PRN
  Administered 2022-03-20: 100 ug via INTRAVENOUS

## 2022-03-20 MED ORDER — TETRACAINE HCL 0.5 % OP SOLN
1.0000 [drp] | OPHTHALMIC | Status: DC | PRN
  Administered 2022-03-20 (×3): 1 [drp] via OPHTHALMIC

## 2022-03-20 MED ORDER — BRIMONIDINE TARTRATE-TIMOLOL 0.2-0.5 % OP SOLN
OPHTHALMIC | Status: DC | PRN
Start: 2022-03-20 — End: 2022-03-20
  Administered 2022-03-20: 1 [drp] via OPHTHALMIC

## 2022-03-20 MED ORDER — LACTATED RINGERS IV SOLN: INTRAVENOUS | Status: DC

## 2022-03-20 MED ORDER — MIDAZOLAM HCL 2 MG/2ML IJ SOLN
INTRAMUSCULAR | Status: DC | PRN
  Administered 2022-03-20: 2 mg via INTRAVENOUS

## 2022-03-20 MED ORDER — SIGHTPATH DOSE#1 BSS IO SOLN
INTRAOCULAR | Status: DC | PRN
  Administered 2022-03-20: 15 mL

## 2022-03-20 MED ORDER — ARMC OPHTHALMIC DILATING DROPS
1.0000 "application " | OPHTHALMIC | Status: DC | PRN
  Administered 2022-03-20 (×3): 1 via OPHTHALMIC

## 2022-03-20 SURGICAL SUPPLY — 10 items
CATARACT SUITE SIGHTPATH (MISCELLANEOUS) ×2 IMPLANT
FEE CATARACT SUITE SIGHTPATH (MISCELLANEOUS) ×1 IMPLANT
GLOVE SURG ENC TEXT LTX SZ8 (GLOVE) ×2 IMPLANT
GLOVE SURG TRIUMPH 8.0 PF LTX (GLOVE) ×2 IMPLANT
LENS IOL TECNIS EYHANCE 17.0 (Intraocular Lens) ×1 IMPLANT
NDL FILTER BLUNT 18X1 1/2 (NEEDLE) ×1 IMPLANT
NEEDLE FILTER BLUNT 18X 1/2SAF (NEEDLE) ×1
NEEDLE FILTER BLUNT 18X1 1/2 (NEEDLE) ×1 IMPLANT
SYR 3ML LL SCALE MARK (SYRINGE) ×2 IMPLANT
WATER STERILE IRR 250ML POUR (IV SOLUTION) ×2 IMPLANT

## 2022-03-20 NOTE — H&P (Signed)
[place]  ? ?Primary Care Physician:  Fabian November., MD ?Ophthalmologist: Dr. George Ina ? ?Pre-Procedure History & Physical: ?HPI:  Dorothy Turner is a 76 y.o. female here for cataract surgery. ?  ?Past Medical History:  ?Diagnosis Date  ? Atrophic vaginitis   ? Breast cancer ([place]) 2001  ? right breast ca with lumpectomy and rad tx.   ? Cancer ([place])   ? skin- squamous and basil cell   ? GERD (gastroesophageal reflux disease)   ? Headache   ? 2-3x/week.  Stress  ? Hemochromatosis   ? Hyperlipidemia   ? IBS (irritable bowel syndrome)   ? Malignant neoplasm of breast ([place])   ? Paget disease, extra-mammary   ? Personal history of radiation therapy 2001  ? BREAST CA  ? Plantar fibromatosis   ? Tinnitus   ? Vertigo   ? several months ago  ? ? ?Past Surgical History:  ?Procedure Laterality Date  ? ANTERIOR AND POSTERIOR VAGINAL REPAIR  09/14/2019  ? [facility]  ? BASAL CELL CARCINOMA EXCISION    ? BLADDER SUSPENSION  09/14/2019  ? [facility]  ? BREAST BIOPSY Left 2001  ? benign, marked placed  ? BREAST BIOPSY Right 2001  ? positive  ? BREAST LUMPECTOMY Right   ? 2001 with radiation  ? CATARACT EXTRACTION W/PHACO Left 03/15/2020  ? Procedure: CATARACT EXTRACTION PHACO AND INTRAOCULAR LENS PLACEMENT (IOC) LEFT 10.91 01:01.4;  Surgeon: Birder Robson, MD;  Location: [place];  Service: Ophthalmology;  Laterality: Left;  ? COLONOSCOPY WITH PROPOFOL N/A 06/10/2020  ? Procedure: COLONOSCOPY WITH PROPOFOL;  Surgeon: Lesly Rubenstein, MD;  Location: [facility] ENDOSCOPY;  Service: Endoscopy;  Laterality: N/A;  ? left eye    ? reconstructive from skin cancer  ? MASTECTOMY Right 2001  ? partial  ? TOTAL KNEE ARTHROPLASTY Left 02/21/2021  ? Procedure: TOTAL KNEE ARTHROPLASTY;  Surgeon: Corky Mull, MD;  Location: [facility] ORS;  Service: Orthopedics;  Laterality: Left;  ? VAGINAL HYSTERECTOMY  09/14/2019  ? [facility]  ? VULVECTOMY PARTIAL  2019  ? ? ?Prior to Admission medications   ?Medication Sig Start Date End Date Taking?  Authorizing Provider  ?acetaminophen (TYLENOL) 500 MG tablet Take 1,000 mg by mouth 2 (two) times daily. 08/27/19  Yes [provider]  ?Carboxymethylcellulose Sodium (REFRESH LIQUIGEL OP) Place 1 drop into the left eye daily as needed (Dry eye).   Yes [provider]  ?imiquimod (ALDARA) 5 % cream Apply topically. 09/25/21  Yes [provider]  ?MULTIPLE VITAMIN PO Take by mouth.   Yes [provider]  ?Omega-3 Fatty Acids (FISH OIL) 1000 MG CAPS Take 1,000-2,000 mg by mouth daily.   Yes [provider]  ?[place] 1 application into the left eye at bedtime. Hylonight eye ointment 5 g   Yes [provider]  ?polyethylene glycol (MIRALAX / GLYCOLAX) 17 g packet Take 17 g by mouth every other day.   Yes [provider]  ?Turmeric 500 MG CAPS Take by mouth.   Yes [provider]  ?valACYclovir (VALTREX) 500 MG tablet Take 500 mg by mouth daily. 12/23/20  Yes [provider]  ?vitamin B-12 (CYANOCOBALAMIN) 1000 MCG tablet Take 1,000 mcg by mouth daily.   Yes [provider]  ?zolpidem (AMBIEN) 5 MG tablet Take 5 mg by mouth 3 (three) times a week. 01/29/17  Yes [provider]  ? ? ?Allergies as of 03/05/2022 - Review Complete 02/09/2022  ?Allergen Reaction  Noted  ? Ciprofloxacin Other (See Comments) 03/08/2020  ? Diphenhydramine hcl Other (See Comments) 04/23/2019  ? Meloxicam Other (See Comments) 03/08/2020  ? ? ?Family History  ?Problem Relation Age of Onset  ? Breast cancer Cousin   ?     maternal cousin  ? Heart disease Mother   ? Colon cancer Mother   ? Atrial fibrillation Mother   ? Hypertension Mother   ? COPD Father   ? ? ?Social History  ? ?Socioeconomic History  ? Marital status: Married  ?  Spouse name: Gwyndolyn Saxon  ? Number of children: 3  ? Years of education: Not on file  ? Highest education level: Not on file  ?Occupational History  ? Not on file  ?Tobacco Use  ? Smoking status: Former  ?   Packs/day: 0.50  ?  Years: 41.50  ?  Pack years: 20.75  ?  Types: Cigarettes  ?  Quit date: 1995  ?  Years since quitting: 28.3  ? Smokeless tobacco: Never  ?Vaping Use  ? Vaping Use: Never used  ?Substance and Sexual Activity  ? Alcohol use: Yes  ?  Alcohol/week: 10.0 standard drinks  ?  Types: 10 Glasses of wine per week  ?  Comment: 1-2 glasses of wine per night  ? Drug use: Never  ? Sexual activity: Not Currently  ?Other Topics Concern  ? Not on file  ?Social History Narrative  ? Not on file  ? ?Social Determinants of Health  ? ?Financial Resource Strain: Not on file  ?Food Insecurity: Not on file  ?Transportation Needs: Not on file  ?Physical Activity: Not on file  ?Stress: Not on file  ?Social Connections: Not on file  ?Intimate Partner Violence: Not on file  ? ? ?Review of Systems: ?See HPI, otherwise negative ROS ? ?Physical Exam: ?BP (!) 155/82   Pulse 85   Temp (!) 97.2 ?F (36.2 ?C)   Wt 68.5 kg   SpO2 100%   BMI 26.75 kg/m?  ?General:   Alert, cooperative in NAD ?Head:  Normocephalic and atraumatic. ?Respiratory:  Normal work of breathing. ?Cardiovascular:  RRR ? ?Impression/Plan: ?Dorothy Turner is here for cataract surgery. ? ?Risks, benefits, limitations, and alternatives regarding cataract surgery have been reviewed with the patient.  Questions have been answered.  All parties agreeable. ? ? ?Birder Robson, MD  03/20/2022, 7:50 AM ? ? ?

## 2022-03-20 NOTE — Op Note (Signed)
PREOPERATIVE DIAGNOSIS:  Nuclear sclerotic cataract of the right eye. ?  ?POSTOPERATIVE DIAGNOSIS:  H25.11 Cataract ?  ?OPERATIVE PROCEDURE:ORPROCALL@ ?  ?SURGEON:  Birder Robson, MD. ?  ?ANESTHESIA: ? ?Anesthesiologist: Elgie Collard, MD ?CRNA: Silvana Newness, CRNA ? ?1.      Managed anesthesia care. ?2.      0.29m of Shugarcaine was instilled in the eye following the paracentesis. ?  ?COMPLICATIONS:  None. ?  ?TECHNIQUE:   Stop and chop ?  ?DESCRIPTION OF PROCEDURE:  The patient was examined and consented in the preoperative holding area where the aforementioned topical anesthesia was applied to the right eye and then brought back to the Operating Room where the right eye was prepped and draped in the usual sterile ophthalmic fashion and a lid speculum was placed. A paracentesis was created with the side port blade and the anterior chamber was filled with viscoelastic. A near clear corneal incision was performed with the steel keratome. A continuous curvilinear capsulorrhexis was performed with a cystotome followed by the capsulorrhexis forceps. Hydrodissection and hydrodelineation were carried out with BSS on a blunt cannula. The lens was removed in a stop and chop  technique and the remaining cortical material was removed with the irrigation-aspiration handpiece. The capsular bag was inflated with viscoelastic and the Technis [id number]  lens was placed in the capsular bag without complication. The remaining viscoelastic was removed from the eye with the irrigation-aspiration handpiece. The wounds were hydrated. The anterior chamber was flushed with BSS and the eye was inflated to physiologic pressure. 0.119mof Vigamox was placed in the anterior chamber. The wounds were found to be water tight. The eye was dressed with Combigan. The patient was given protective glasses to wear throughout the day and a shield with which to sleep tonight. The patient was also given drops with which to begin a drop regimen today and  will follow-up with me in one day. ?Implant Name Type Inv. Item Serial No. Manufacturer Lot No. LRB No. Used Action  ?LENS IOL TECNIS EYHANCE 17.0 - [id number] Lens LENS IOL TECNIS EYHANCE 17.0 [id number]  Right 1 Implanted  ? ?Procedure(s) with comments: ?CATARACT EXTRACTION PHACO AND INTRAOCULAR LENS PLACEMENT (IOC) RIGHT (Right) - 10.61 ?00:56.3 ? ?Electronically signed: WiBirder Robson/07/2022 8:15 AM ? ?

## 2022-03-20 NOTE — Anesthesia Preprocedure Evaluation (Signed)
Anesthesia Evaluation  ?Patient identified by MRN, date of birth, ID band ?Patient awake ? ? ? ?Reviewed: ?Allergy & Precautions, H&P , NPO status , Patient's Chart, lab work & pertinent test results ? ?Airway ?Mallampati: II ? ?TM Distance: >3 FB ?Neck ROM: full ? ? ? Dental ?no notable dental hx. ? ?  ?Pulmonary ?neg pulmonary ROS, former smoker,  ?  ?Pulmonary exam normal ? ? ? ? ? ? ? Cardiovascular ?negative cardio ROS ?Normal cardiovascular exam ?Rhythm:regular Rate:Normal ? ? ?  ?Neuro/Psych ? Headaches,   ? GI/Hepatic ?Neg liver ROS, Medicated,  ?Endo/Other  ?Hypothyroidism  ? Renal/GU ?  ?negative genitourinary ?  ?Musculoskeletal ? ? Abdominal ?  ?Peds ? Hematology ?negative hematology ROS ?(+)   ?Anesthesia Other Findings ? ? Reproductive/Obstetrics ? ?  ? ? ? ? ? ? ? ? ? ? ? ? ? ?  ?  ? ? ? ? ? ? ? ? ?Anesthesia Physical ?Anesthesia Plan ? ?ASA: 2 ? ?Anesthesia Plan: MAC  ? ?Post-op Pain Management:   ? ?Induction:  ? ?PONV Risk Score and Plan: 2 and Midazolam, TIVA and Treatment may vary due to age or medical condition ? ?Airway Management Planned:  ? ?Additional Equipment:  ? ?Intra-op Plan:  ? ?Post-operative Plan:  ? ?Informed Consent: I have reviewed the patients History and Physical, chart, labs and discussed the procedure including the risks, benefits and alternatives for the proposed anesthesia with the patient or authorized representative who has indicated his/her understanding and acceptance.  ? ? ? ? ? ?Plan Discussed with:  ? ?Anesthesia Plan Comments:   ? ? ? ? ? ? ?Anesthesia Quick Evaluation ? ?

## 2022-03-20 NOTE — Transfer of Care (Signed)
Immediate Anesthesia Transfer of Care Note ? ?Patient: Dorothy Turner ? ?Procedure(s) Performed: CATARACT EXTRACTION PHACO AND INTRAOCULAR LENS PLACEMENT (IOC) RIGHT (Right: Eye) ? ?Patient Location: PACU ? ?Anesthesia Type: MAC ? ?Level of Consciousness: awake, alert  and patient cooperative ? ?Airway and Oxygen Therapy: Patient Spontanous Breathing and Patient connected to supplemental oxygen ? ?Post-op Assessment: Post-op Vital signs reviewed, Patient's Cardiovascular Status Stable, Respiratory Function Stable, Patent Airway and No signs of Nausea or vomiting ? ?Post-op Vital Signs: Reviewed and stable ? ?Complications: No notable events documented. ? ?

## 2022-03-20 NOTE — Anesthesia Postprocedure Evaluation (Signed)
Anesthesia Post Note ? ?Patient: Dorothy Turner ? ?Procedure(s) Performed: CATARACT EXTRACTION PHACO AND INTRAOCULAR LENS PLACEMENT (IOC) RIGHT (Right: Eye) ? ? ?  ?Patient location during evaluation: PACU ?Anesthesia Type: MAC ?Level of consciousness: awake and alert ?Pain management: pain level controlled ?Vital Signs Assessment: post-procedure vital signs reviewed and stable ?Respiratory status: spontaneous breathing ?Cardiovascular status: stable ?Anesthetic complications: no ? ? ?No notable events documented. ? ?Virl Axe,  Leanne Sisler D ? ? ? ? ? ?

## 2022-03-21 ENCOUNTER — Encounter: Payer: Self-pay | Admitting: Ophthalmology

## 2022-04-06 ENCOUNTER — Ambulatory Visit (INDEPENDENT_AMBULATORY_CARE_PROVIDER_SITE_OTHER): Payer: Self-pay

## 2022-04-06 DIAGNOSIS — L603 Nail dystrophy: Secondary | ICD-10-CM

## 2022-04-06 DIAGNOSIS — B351 Tinea unguium: Secondary | ICD-10-CM

## 2022-04-06 NOTE — Progress Notes (Signed)
Patient presents today for the 9th laser treatment. Diagnosed with mycotic nail infection by Dr. Milinda Pointer.   Toenail most affected hallux left.   Dr. Milinda Pointer saw the patient in October for re-evaluation. He would like for her to do a treatment every 8 weeks until nail has completely grown out.   All other systems are negative.  Nails were filed thin. Laser therapy was administered to 1st toenails left and patient tolerated the treatment well. All safety precautions were in place.   Follow up in 8 weeks for laser #10.

## 2022-05-28 ENCOUNTER — Other Ambulatory Visit: Payer: Self-pay | Admitting: *Deleted

## 2022-05-28 DIAGNOSIS — Z1231 Encounter for screening mammogram for malignant neoplasm of breast: Secondary | ICD-10-CM

## 2022-06-01 ENCOUNTER — Ambulatory Visit (INDEPENDENT_AMBULATORY_CARE_PROVIDER_SITE_OTHER): Payer: Self-pay | Admitting: Podiatry

## 2022-06-01 DIAGNOSIS — L603 Nail dystrophy: Secondary | ICD-10-CM

## 2022-06-01 DIAGNOSIS — B351 Tinea unguium: Secondary | ICD-10-CM

## 2022-06-01 NOTE — Progress Notes (Signed)
Patient presents today for the 10th laser treatment. Diagnosed with mycotic nail infection by Dr. Milinda Pointer.    Toenail most affected hallux left.    Dr. Milinda Pointer saw the patient in October for re-evaluation. He would like for her to do a treatment every 8 weeks until nail has completely grown out.    All other systems are negative.   Nails were filed thin. Laser therapy was administered to 1st toenail left and patient tolerated the treatment well. All safety precautions were in place.    Follow up in 8 weeks for laser #11.

## 2022-06-22 ENCOUNTER — Ambulatory Visit
Admission: RE | Admit: 2022-06-22 | Discharge: 2022-06-22 | Disposition: A | Discharge status: Home / Self Care | Payer: Medicare Other | Source: Ambulatory Visit | Attending: *Deleted | Admitting: *Deleted

## 2022-06-22 DIAGNOSIS — Z1231 Encounter for screening mammogram for malignant neoplasm of breast: Secondary | ICD-10-CM | POA: Insufficient documentation

## 2022-07-27 ENCOUNTER — Ambulatory Visit (INDEPENDENT_AMBULATORY_CARE_PROVIDER_SITE_OTHER): Payer: Medicare Other

## 2022-07-27 DIAGNOSIS — B351 Tinea unguium: Secondary | ICD-10-CM

## 2022-07-27 DIAGNOSIS — L603 Nail dystrophy: Secondary | ICD-10-CM

## 2022-07-27 NOTE — Progress Notes (Signed)
Patient presents today for the 11th laser treatment. Diagnosed with mycotic nail infection by Dr. Milinda Pointer.    Toenail most affected hallux left.    Dr. Milinda Pointer saw the patient in October for re-evaluation. He would like for her to do a treatment every 8 weeks until nail has completely grown out.    All other systems are negative.   Nails were filed thin. Laser therapy was administered to 1st toenail left and patient tolerated the treatment well. All safety precautions were in place.    Follow up in 8 weeks for laser #12.

## 2022-09-21 ENCOUNTER — Other Ambulatory Visit: Payer: Federal, State, Local not specified - PPO

## 2022-10-02 ENCOUNTER — Other Ambulatory Visit: Payer: Medicare Other

## 2023-05-27 ENCOUNTER — Other Ambulatory Visit: Payer: Self-pay | Admitting: Internal Medicine

## 2023-05-27 DIAGNOSIS — Z1231 Encounter for screening mammogram for malignant neoplasm of breast: Secondary | ICD-10-CM

## 2023-06-25 ENCOUNTER — Ambulatory Visit
Admission: RE | Admit: 2023-06-25 | Discharge: 2023-06-25 | Disposition: A | Discharge status: Home / Self Care | Payer: Medicare Other | Source: Ambulatory Visit | Attending: Internal Medicine | Admitting: Internal Medicine

## 2023-06-25 DIAGNOSIS — Z1231 Encounter for screening mammogram for malignant neoplasm of breast: Secondary | ICD-10-CM | POA: Diagnosis present

## 2024-04-30 ENCOUNTER — Other Ambulatory Visit: Payer: Self-pay | Admitting: Obstetrics and Gynecology

## 2024-04-30 DIAGNOSIS — Z1231 Encounter for screening mammogram for malignant neoplasm of breast: Secondary | ICD-10-CM

## 2024-06-18 ENCOUNTER — Ambulatory Visit
Admission: EM | Admit: 2024-06-18 | Discharge: 2024-06-18 | Disposition: A | Discharge status: Home / Self Care | Attending: Emergency Medicine | Admitting: Emergency Medicine

## 2024-06-18 DIAGNOSIS — R009 Unspecified abnormalities of heart beat: Secondary | ICD-10-CM

## 2024-06-18 DIAGNOSIS — J029 Acute pharyngitis, unspecified: Secondary | ICD-10-CM | POA: Insufficient documentation

## 2024-06-18 DIAGNOSIS — R030 Elevated blood-pressure reading, without diagnosis of hypertension: Secondary | ICD-10-CM | POA: Insufficient documentation

## 2024-06-18 LAB — GROUP A STREP BY PCR: Group A Strep by PCR: NOT DETECTED

## 2024-06-18 NOTE — Discharge Instructions (Addendum)
 Your strep test was negative Most likely you have a viral illness: no antibiotic is indicated at this time, May treat with OTC meds of choice(Chloraseptic throat lozenges while awake,tylenol , etc). Make sure to drink plenty of fluids to stay hydrated(gatorade, water, popsicles,jello,etc, hot tea), avoid caffeine products. Follow up with PCP. Return as needed. If you develop chest pain,shortness of breath, palpitations, go to ER for further evaluation.

## 2024-06-18 NOTE — ED Triage Notes (Signed)
 Patient states that she's had a sore throat for 3 days. Patient states that she's not having any other sx. Pain is 6-10. Patient states that she took tylenol  at 4 am. Patient states that tylenol  does help with the pain.

## 2024-06-18 NOTE — ED Provider Notes (Signed)
 [facility]    CSN: [id number] Arrival date & time: 06/18/24  9182      History   Chief Complaint Chief Complaint  Patient presents with   Sore Throat    HPI Dorothy Turner is a 78 y.o. female.   78 year old female, Dorothy Turner, presents to urgent care for evaluation of sore throat that she has had for 3 days.  Patient states that she is not having any other symptoms and the pain is 6/10.  Patient states that she took Tylenol  at 4 AM but the Tylenol  does not help the pain. Pt denies known illness exposure.  The history is provided by the patient. No language interpreter was used.    Past Medical History:  Diagnosis Date   Atrophic vaginitis    Breast cancer (HCC) 2001   right breast ca with lumpectomy and rad tx.    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   Status post total knee replacement using cement, 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/2021   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/24/2020   Mixed incontinence 08/14/2019   Anxiety 06/11/2019   Elevated C-reactive protein 06/11/2019   Exposure to mold 06/11/2019   Vitamin B12 deficiency (non anemic) 06/11/2019   Vitamin D  deficiency 06/11/2019   Aching pain 04/01/2019   Headache 04/01/2019   Pelvic floor dysfunction 04/01/2019   Restless legs 04/01/2019    Dizziness 03/04/2019   Insomnia 03/04/2019   Tinnitus 03/04/2019   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/21/2018   Abdominal bloating 02/20/2018   Dysuria 02/20/2018   Uterine prolapse 02/20/2018   Primary osteoarthritis of left knee 03/25/2017   Heart palpitations 02/09/2016   Gastro-esophageal reflux disease without esophagitis 04/18/2015   Hyperlipidemia 04/18/2015   Irritable bowel syndrome without diarrhea 04/18/2015   Atrophic vaginitis 04/18/2015   Malignant neoplasm of breast (HCC) 04/18/2015   Plantar fascial fibromatosis 04/18/2015   ANA positive 03/28/2015   Lichen 03/10/2015    Past Surgical History:  Procedure Laterality Date   ANTERIOR AND POSTERIOR VAGINAL REPAIR  09/14/2019   [facility]   BASAL CELL CARCINOMA EXCISION     BLADDER SUSPENSION  09/14/2019   [facility]   BREAST BIOPSY Left 2001   benign, marked placed   BREAST BIOPSY Right 2001   positive   BREAST LUMPECTOMY Right    2001 with radiation   CATARACT EXTRACTION W/PHACO Left 03/15/2020   Procedure: CATARACT EXTRACTION PHACO AND INTRAOCULAR LENS PLACEMENT (IOC) LEFT 10.91 01:01.4;  Surgeon: Jaye Fallow, MD;  Location: [facility];  Service: Ophthalmology;  Laterality: Left;   CATARACT EXTRACTION W/PHACO Right 03/20/2022   Procedure: CATARACT EXTRACTION PHACO AND INTRAOCULAR LENS PLACEMENT (IOC) RIGHT;  Surgeon: Jaye Fallow, MD;  Location: [facility];  Service:  Ophthalmology;  Laterality: Right;  10.61 00:56.3   COLONOSCOPY WITH PROPOFOL  N/A 06/10/2020   Procedure: COLONOSCOPY WITH PROPOFOL ;  Surgeon: Maryruth Ole DASEN, MD;  Location: [facility];  Service: Endoscopy;  Laterality: N/A;   left eye     reconstructive from skin cancer   MASTECTOMY Right 2001   partial   TOTAL KNEE ARTHROPLASTY Left 02/21/2021   Procedure: TOTAL KNEE ARTHROPLASTY;  Surgeon: Edie Norleen PARAS, MD;  Location: [facility];  Service: Orthopedics;  Laterality: Left;   VAGINAL HYSTERECTOMY  09/14/2019   [facility]   VULVECTOMY  PARTIAL  2019    OB History     Gravida  3   Para  3   Term  3   Preterm      AB      Living  3      SAB      IAB      Ectopic      Multiple      Live Births  3            Home Medications    Prior to Admission medications   Medication Sig Start Date End Date Taking? Authorizing Provider  valACYclovir  (VALTREX ) 500 MG tablet Take 500 mg by mouth daily. 12/23/20  Yes [provider]  acetaminophen  (TYLENOL ) 500 MG tablet Take 1,000 mg by mouth 2 (two) times daily. 08/27/19   [provider]  Carboxymethylcellulose Sodium (REFRESH LIQUIGEL OP) Place 1 drop into the left eye daily as needed (Dry eye).    [provider]  imiquimod (ALDARA) 5 % cream Apply topically. 09/25/21   [provider]  MULTIPLE VITAMIN PO Take by mouth.    [provider]  Omega-3 Fatty Acids (FISH OIL) 1000 MG CAPS Take 1,000-2,000 mg by mouth daily.    [provider]  OVER THE COUNTER MEDICATION Place 1 application into the left eye at bedtime. Hylonight eye ointment 5 g    [provider]  polyethylene glycol (MIRALAX  / GLYCOLAX ) 17 g packet Take 17 g by mouth every other day.    [provider]  Turmeric 500 MG CAPS Take by mouth.    [provider]  vitamin B-12 (CYANOCOBALAMIN ) 1000 MCG tablet Take 1,000 mcg by mouth daily.    [provider]  zolpidem  (AMBIEN ) 5 MG tablet Take 5 mg by mouth 3 (three) times a week. 01/29/17   [provider]    Family History Family History  Problem Relation Age of Onset   Breast cancer Cousin        maternal cousin   Heart disease Mother    Colon cancer Mother    Atrial fibrillation Mother    Hypertension Mother    COPD Father     Social History Social History   Tobacco Use   Smoking status: Former    Current packs/day: 0.00    Average packs/day: 0.5 packs/day for 41.5 years (20.8 ttl pk-yrs)    Types: Cigarettes    Start date: 05/1952     Quit date: 1995    Years since quitting: 30.6   Smokeless tobacco: Never  Vaping Use   Vaping status: Never Used  Substance Use Topics   Alcohol use: Yes    Alcohol/week: 10.0 standard drinks of alcohol    Types: 10 Glasses of wine per week    Comment: 1-2 glasses of wine per night   Drug use: Never     Allergies   Ciprofloxacin, Diphenhydramine  hcl,  and Meloxicam    Review of Systems Review of Systems  Constitutional:  Negative for fever.  HENT:  Positive for sore throat. Negative for congestion and trouble swallowing.   Respiratory:  Negative for cough.   All other systems reviewed and are negative.    Physical Exam Triage Vital Signs ED Triage Vitals  Encounter Vitals Group     BP 06/18/24 0834 (!) 149/80     Girls Systolic BP Percentile --      Girls Diastolic BP Percentile --      Boys Systolic BP Percentile --      Boys Diastolic BP Percentile --      Pulse Rate 06/18/24 0834 71     Resp 06/18/24 0834 17     Temp 06/18/24 0834 98.2 F (36.8 C)     Temp Source 06/18/24 0834 Oral     SpO2 06/18/24 0834 100 %     Weight --      Height --      Head Circumference --      Peak Flow --      Pain Score 06/18/24 0832 6     Pain Loc --      Pain Education --      Exclude from Growth Chart --    No data found.  Updated Vital Signs BP (!) 149/80 (BP Location: Left Arm)   Pulse 71   Temp 98.2 F (36.8 C) (Oral)   Resp 17   SpO2 100%   Visual Acuity Right Eye Distance:   Left Eye Distance:   Bilateral Distance:    Right Eye Near:   Left Eye Near:    Bilateral Near:     Physical Exam Vitals and nursing note reviewed.  Constitutional:      General: She is not in acute distress.    Appearance: She is well-developed and well-groomed.  HENT:     Head: Normocephalic.     Right Ear: Tympanic membrane is retracted.     Left Ear: Tympanic membrane is retracted.     Nose: Congestion present.     Mouth/Throat:     Lips: Pink.     Mouth: Mucous  membranes are moist.     Pharynx: Uvula midline. Posterior oropharyngeal erythema present.     Tonsils: No tonsillar exudate or tonsillar abscesses.  Eyes:     General: Lids are normal.     Conjunctiva/sclera: Conjunctivae normal.     Pupils: Pupils are equal, round, and reactive to light.  Neck:     Trachea: No tracheal deviation.  Cardiovascular:     Rate and Rhythm: Normal rate.     Heart sounds: No murmur heard. Pulmonary:     Effort: Pulmonary effort is normal.  Abdominal:     General: Bowel sounds are normal.     Palpations: Abdomen is soft.     Tenderness: There is no abdominal tenderness.  Musculoskeletal:        General: Normal range of motion.     Cervical back: Normal range of motion.  Lymphadenopathy:     Cervical: Cervical adenopathy present.     Right cervical: Superficial cervical adenopathy present.     Left cervical: No superficial cervical adenopathy.  Skin:    Findings: No rash.  Neurological:     General: No focal deficit present.     Mental Status: She is alert and oriented to person, place, and time.     GCS: GCS eye subscore is 4. GCS verbal  subscore is 5. GCS motor subscore is 6.  Psychiatric:        Attention and Perception: Attention normal.        Mood and Affect: Mood normal.        Speech: Speech normal.        Behavior: Behavior normal. Behavior is cooperative.      UC Treatments / Results  Labs (all labs ordered are listed, but only abnormal results are displayed) Labs Reviewed  GROUP A STREP BY PCR    EKG   Radiology No results found.  Procedures Procedures (including critical care time)  Medications Ordered in UC Medications - No data to display  Initial Impression / Assessment and Plan / UC Course  I have reviewed the triage vital signs and the nursing notes.  Pertinent labs & imaging results that were available during my care of the patient were reviewed by me and considered in my medical decision making (see chart for  details).  Clinical Course as of 06/18/24 1127  Thu Jun 18, 2024  9154 Strep test ordered for sore throat [JD]  0917 Strep negative [JD]    Clinical Course User Index [JD] Khye Hochstetler, Rilla, NP   Discussed exam findings and plan of care with patient, strict go to ER precautions given.   Patient verbalized understanding to this provider.  Ddx: Viral pharyngitis, otalgia, seasonal allergies Final Clinical Impressions(s) / UC Diagnoses   Final diagnoses:  Viral pharyngitis  Elevated blood pressure reading in office without diagnosis of hypertension     Discharge Instructions      Your strep test was negative Most likely you have a viral illness: no antibiotic is indicated at this time, May treat with OTC meds of choice(Chloraseptic throat lozenges while awake,tylenol , etc). Make sure to drink plenty of fluids to stay hydrated(gatorade, water, popsicles,jello,etc, hot tea), avoid caffeine products. Follow up with PCP. Return as needed. If you develop chest pain,shortness of breath, palpitations, go to ER for further evaluation.      ED Prescriptions   None    PDMP not reviewed this encounter.   Aminta Rilla, NP 06/18/24 1128

## 2024-07-21 ENCOUNTER — Ambulatory Visit
Admission: RE | Admit: 2024-07-21 | Discharge: 2024-07-21 | Disposition: A | Discharge status: Home / Self Care | Source: Ambulatory Visit | Attending: Obstetrics and Gynecology | Admitting: Obstetrics and Gynecology

## 2024-07-21 DIAGNOSIS — Z1231 Encounter for screening mammogram for malignant neoplasm of breast: Secondary | ICD-10-CM | POA: Diagnosis present
